# Patient Record
Sex: Male | Born: 1950 | Race: White | Hispanic: No | Marital: Married
Health system: Midwestern US, Community
[De-identification: ages and names within clinical notes are randomized; demographics above are authoritative.]

## PROBLEM LIST (undated history)

## (undated) DIAGNOSIS — I10 Essential (primary) hypertension: Secondary | ICD-10-CM

## (undated) DIAGNOSIS — Z1211 Encounter for screening for malignant neoplasm of colon: Secondary | ICD-10-CM

## (undated) DIAGNOSIS — K2270 Barrett's esophagus without dysplasia: Secondary | ICD-10-CM

## (undated) DIAGNOSIS — N400 Enlarged prostate without lower urinary tract symptoms: Secondary | ICD-10-CM

## (undated) DIAGNOSIS — N5314 Retrograde ejaculation: Secondary | ICD-10-CM

## (undated) DIAGNOSIS — L659 Nonscarring hair loss, unspecified: Secondary | ICD-10-CM

## (undated) HISTORY — PX: HERNIA REPAIR: SHX51

## (undated) HISTORY — DX: Barrett's esophagus without dysplasia: K22.70

## (undated) HISTORY — DX: Essential (primary) hypertension: I10

## (undated) HISTORY — PX: CYSTOSCOPY WITH INSERTION OF UROLIFT: SHX6678

## (undated) HISTORY — DX: Nonscarring hair loss, unspecified: L65.9

## (undated) HISTORY — PX: DENTAL SURGERY: SHX609

## (undated) HISTORY — DX: Retrograde ejaculation: N53.14

## (undated) HISTORY — DX: Benign prostatic hyperplasia without lower urinary tract symptoms: N40.0

---

## 2008-12-04 HISTORY — PX: EYE SURGERY: SHX253

## 2012-01-22 ENCOUNTER — Encounter

## 2012-01-26 NOTE — Other (Signed)
Cardiopulmonary Rehab:  Patient called and calcium score of "551" given.  Education given regarding coronary artery disease and its effects on the cardiovascular system reviewed.  Patient states that they have a family history of coronary artery disease, currently does not take cholesterol medication but does blood pressure medication.  Patient is not a diabetic, is a former smoker quitting 30 years ago, states that he has just started making heart healthy choices this past month and does not exercise. Patient states that he knew something was going to be wrong because lately he has been getting winded fairly easily on exertion.  Patient's primary care is [facility].  Patient requested to come by the hospital to pick up copies of the results.  [facility] number given. Understanding verbalized.

## 2017-04-23 NOTE — H&P (Signed)
66 y.o. male for open access colonoscopy for screening   Additional data for completion of the targeted pre-endoscopy H&P will be provided under 'H&P interval notes'.  Please see that document which will be attached to this.  Leta Spellerhristopher D Destina Mantei, MD  Colon [address]2012 TA [place]ieb [place]

## 2017-04-25 ENCOUNTER — Inpatient Hospital Stay: Payer: PRIVATE HEALTH INSURANCE

## 2017-04-25 LAB — POC H. PYLORI, TISSUE
H. pylori from tissue: NEGATIVE
Lot no.: 293107
Negative control: NEGATIVE
Positive control: POSITIVE

## 2017-04-25 MED ORDER — PROPOFOL 10 MG/ML IV EMUL
10 mg/mL | INTRAVENOUS | Status: DC | PRN
Start: 2017-04-25 — End: 2017-04-25
  Administered 2017-04-25: 14:00:00 via INTRAVENOUS

## 2017-04-25 MED ORDER — FENTANYL CITRATE (PF) 50 MCG/ML IJ SOLN
50 mcg/mL | INTRAMUSCULAR | Status: DC | PRN
Start: 2017-04-25 — End: 2017-04-25

## 2017-04-25 MED ORDER — SIMETHICONE 40 MG/0.6 ML ORAL DROPS, SUSP
40 mg/0.6 mL | ORAL | Status: DC | PRN
Start: 2017-04-25 — End: 2017-04-25

## 2017-04-25 MED ORDER — NALOXONE 0.4 MG/ML INJECTION
0.4 mg/mL | INTRAMUSCULAR | Status: DC | PRN
Start: 2017-04-25 — End: 2017-04-25

## 2017-04-25 MED ORDER — PANTOPRAZOLE 20 MG TAB, DELAYED RELEASE
20 mg | ORAL_TABLET | Freq: Every day | ORAL | 0 refills | Status: AC
Start: 2017-04-25 — End: ?

## 2017-04-25 MED ORDER — LIDOCAINE (PF) 20 MG/ML (2 %) IJ SOLN
20 mg/mL (2 %) | INTRAMUSCULAR | Status: DC | PRN
Start: 2017-04-25 — End: 2017-04-25
  Administered 2017-04-25: 14:00:00 via INTRAVENOUS

## 2017-04-25 MED ORDER — SODIUM CHLORIDE 0.9 % IV
INTRAVENOUS | Status: DC
Start: 2017-04-25 — End: 2017-04-25
  Administered 2017-04-25: 14:00:00 via INTRAVENOUS

## 2017-04-25 MED ORDER — ATROPINE 0.1 MG/ML SYRINGE
0.1 mg/mL | Freq: Once | INTRAMUSCULAR | Status: DC | PRN
Start: 2017-04-25 — End: 2017-04-25

## 2017-04-25 MED ORDER — EPINEPHRINE 0.1 MG/ML SYRINGE
0.1 mg/mL | Freq: Once | INTRAMUSCULAR | Status: DC | PRN
Start: 2017-04-25 — End: 2017-04-25

## 2017-04-25 MED ORDER — FLUMAZENIL 0.1 MG/ML IV SOLN
0.1 mg/mL | INTRAVENOUS | Status: DC | PRN
Start: 2017-04-25 — End: 2017-04-25

## 2017-04-25 MED ORDER — MIDAZOLAM 1 MG/ML IJ SOLN
1 mg/mL | INTRAMUSCULAR | Status: DC | PRN
Start: 2017-04-25 — End: 2017-04-25

## 2017-04-25 MED FILL — SODIUM CHLORIDE 0.9 % IV: INTRAVENOUS | Qty: 1000

## 2017-04-25 NOTE — Procedures (Signed)
Procedures by Earleen Newport, MD at 04/25/17 1014                Author: Earleen Newport, MD  Service: Gastroenterology  Author Type: Physician       Filed: 04/25/17 1045  Date of Service: 04/25/17 1014  Status: Signed          Editor: Earleen Newport, MD (Physician)            Procedures        1. COLONOSCOPY [LKG4010 (Custom)]                                                           Mario D. Ottis Stain, MD   [id number]        Apr 25, 2017      Esophagogastroduodenoscopy & Colonoscopy Procedure Note   Mario Bell   DOB: [birth year]   [name] Medical Record Number: [id number]         Indications:    Barrett's/esophageal ulcer  Personal history of colon polyps (screening only)  and Screening Colonoscopy    Referring Physician:  Raynelle Chary, MD   Anesthesia/Sedation: Conscious Sedation/Moderate Sedation/MAC   Endoscopist:  Dr. Wray Kearns   Complications:  None   Estimated Blood Loss:  None      Permit:   The indications, risks, benefits and alternatives were reviewed with the patient or their decision maker who was provided an opportunity to ask questions  and all questions were answered.  The specific risks of esophagogastroduodenoscopy with conscious sedation were reviewed, including but not limited to anesthetic complication, bleeding, adverse drug reaction, missed lesion, infection, IV site reactions,  and intestinal perforation which would lead to the need for surgical repair.  Alternatives to EGD and colonoscopy including radiographic imaging, observation without testing, or laboratory testing were reviewed as well as the limitations of those alternatives  discussed.  After considering the options and having all their questions answered, the patient or their decision maker provided both verbal and written consent to proceed.        -----------EGD------------    Procedure in Detail:   After  obtaining informed consent, positioning of the patient in the left lateral decubitus position, and conduction of a pre-procedure pause or "time out" the endoscope was introduced into the mouth and advanced to the duodenum.  A careful inspection  was made, and findings or interventions are described below.      Findings:    Esophagus:Short segment Barrett's C1M2.  Cold forceps biopsies taken for histology.   Stomach: Erythema and punctate erosions in the antral mucosa, Biopsies from the mucosa of the gastric antrum and gastric fundus are taken for  rapid helicobacter testing.  Hemostasis is confirmed from biopsy sites.      Duodenum/jejunum: Normal.            ----------Colonoscopy-----------      Procedure in Detail:   After obtaining informed consent, positioning of the patient in the left lateral decubitus position, and conduction of a pre-procedure pause or "time out" the endoscope was introduced into the anus and advanced to the cecum, which was identified by the  ileocecal valve and appendiceal orifice.  The quality of  the colonic preparation was good.  A careful inspection was made as the colonoscope was withdrawn, findings and interventions are described below.      Findings:    Four polyps today   Descending 68m   Sigmoid 153mpedunculated (taken hot), 19m61mnd 4mm4m All taken with snare most cold one hot.      Complete removal, retrieval, and hemostasis.      In the rectum, medium internal hemorrhoids are noted without bleeding.   There is diverticulosis in the sigmoid colon without complications such as bleeding, inflammatory change, or luminal narrowing.         ------------------------------   Specimens:     See above      Complications:    None; patient tolerated the procedure well.      Impressions:   EGD:  Barrett's and mild gastritis.   Colonoscopy: Colon polyps diverticulosis and hemorrhoids         Recommendations:      - Await pathology.   -Acid suppression with a proton pump inhibitor.      Thank  you for entrusting me with this patient's care.  Please do not hesitate to contact me with any questions or if I can be of assistance with any of  your other patients' GI needs.      Signed By: ChriEarleen Newport                         Apr 25, 2017

## 2017-04-25 NOTE — Other (Signed)
Initial RN admission and assessment performed and documented in Endoscopy navigator.     Patient evaluated by anesthesia in pre-procedure holding.    All procedural vital signs, airway assessment, and level of consciousness information monitored and recorded by anesthesia staff on the anesthesia record.

## 2017-04-25 NOTE — Other (Signed)
Judge Stallatrick O Mcginnity  10/28/1951  [id number]    Situation:  Verbal report received from: Maxie Betterebecca Byars RN  Procedure: Procedure(s):  COLONOSCOPY,EGD  ESOPHAGOGASTRODUODENOSCOPY (EGD)  ESOPHAGOGASTRODUODENAL (EGD) BIOPSY    Background:    Preoperative diagnosis: PERSONAL HISTORY COLON POLYPS, BARRETTS  Postoperative diagnosis: Gastritis  Barretts Esophagus  polyps  Diverticulosis  Hemorrhoids  polyps      Operator:  Dr. Nunzio CoryLyons  Assistant(s): Endoscopy Technician-1: Ardeth PerfectSarah Basinger  Endoscopy RN-1: Serita Kyleebecca A Byars, RN  Endoscopy RN-2: Freddie BreechHeidi S Yates, RN    Specimens:   ID Type Source Tests Collected by Time Destination   1 : GE Junction Biopsy Preservative   Leta Spellerhristopher D Lyons, MD 04/25/2017 1022 Pathology   2 :  descending polyp Preservative   Leta Spellerhristopher D Lyons, MD 04/25/2017 1027 Pathology   3 : sigmoid polyp X3 Preservative   Leta Spellerhristopher D Lyons, MD 04/25/2017 1032 Pathology     H. Pylori  yes    Assessment:  Intra-procedure medications     Anesthesia gave intra-procedure sedation and medications, see anesthesia flow sheet yes    Intravenous fluids: NS@ KVO     Vital signs stable     Abdominal assessment: round and soft     Recommendation:  Discharge patient per MD order.  Family or Friend   Permission to share finding with family or friend yes    Endoscopy discharge instructions have been reviewed and given to patient and son.  The patient and son verbalized understanding and acceptance of instructions.

## 2017-04-25 NOTE — Procedures (Signed)
Diamondhead D. Ottis Stain, MD  [phone]      Apr 25, 2017    Esophagogastroduodenoscopy & Colonoscopy Procedure Note  Mario Bell  DOB: 1951/10/23  [name] Medical Record Number: [id number]      Indications:    Barrett's/esophageal ulcer Personal history of colon polyps (screening only)  and Screening Colonoscopy   Referring Physician:  Raynelle Chary, MD  Anesthesia/Sedation: Conscious Sedation/Moderate Sedation/MAC  Endoscopist:  Dr. Wray Kearns  Complications:  None  Estimated Blood Loss:  None    Permit:  The indications, risks, benefits and alternatives were reviewed with the patient or their decision maker who was provided an opportunity to ask questions and all questions were answered.  The specific risks of esophagogastroduodenoscopy with conscious sedation were reviewed, including but not limited to anesthetic complication, bleeding, adverse drug reaction, missed lesion, infection, IV site reactions, and intestinal perforation which would lead to the need for surgical repair.  Alternatives to EGD and colonoscopy including radiographic imaging, observation without testing, or laboratory testing were reviewed as well as the limitations of those alternatives discussed.  After considering the options and having all their questions answered, the patient or their decision maker provided both verbal and written consent to proceed.      -----------EGD------------   Procedure in Detail:  After obtaining informed consent, positioning of the patient in the left lateral decubitus position, and conduction of a pre-procedure pause or "time out" the endoscope was introduced into the mouth and advanced to the duodenum.  A careful inspection was made, and findings or interventions are described below.    Findings:   Esophagus:Short segment Barrett's C1M2.  Cold forceps biopsies taken for histology.   Stomach: Erythema and punctate erosions in the antral mucosa, Biopsies from the mucosa of the gastric antrum and gastric fundus are taken for rapid helicobacter testing.  Hemostasis is confirmed from biopsy sites.    Duodenum/jejunum: Normal.        ----------Colonoscopy-----------    Procedure in Detail:  After obtaining informed consent, positioning of the patient in the left lateral decubitus position, and conduction of a pre-procedure pause or "time out" the endoscope was introduced into the anus and advanced to the cecum, which was identified by the ileocecal valve and appendiceal orifice.  The quality of the colonic preparation was good.  A careful inspection was made as the colonoscope was withdrawn, findings and interventions are described below.    Findings:   Four polyps today  Descending 43m  Sigmoid 133mpedunculated (taken hot), 88m30mnd 4mm52mAll taken with snare most cold one hot.    Complete removal, retrieval, and hemostasis.    In the rectum, medium internal hemorrhoids are noted without bleeding.  There is diverticulosis in the sigmoid colon without complications such as bleeding, inflammatory change, or luminal narrowing.      ------------------------------  Specimens:    See above    Complications:   None; patient tolerated the procedure well.    Impressions:  EGD:  Barrett's and mild gastritis.  Colonoscopy: Colon polyps diverticulosis and hemorrhoids      Recommendations:     - Await pathology.  -Acid suppression with a proton pump inhibitor.    Thank you for entrusting me with this patient's care.  Please do not hesitate to contact me with any questions or if I can be of assistance with any of your other patients' GI needs.  Signed By: Earleen Newport, MD                        Apr 25, 2017

## 2017-04-25 NOTE — Other (Signed)
H.Pylori obtained.

## 2017-04-25 NOTE — Other (Signed)
Report form Jennifer, CRNA, see anesthesia record. ABD remains soft and non-tender post procedure.  Pt has no complaints at this time and tolerated the procedure well.    Endoscope was pre-cleaned at bedside immediately following procedure by Sarah.

## 2017-04-25 NOTE — Anesthesia Post-Procedure Evaluation (Signed)
Post-Anesthesia Evaluation and Assessment    Patient: Mario Bell MRN: [id number]  SSN: [id number]    Date of Birth: 08-07-1951  Age: 66 y.o.  Sex: male       Cardiovascular Function/Vital Signs  Visit Vitals   ??? BP 95/61   ??? Pulse (!) 53   ??? Temp 36.7 ??C (98 ??F)   ??? Resp 15   ??? Ht 5' 10"  (1.778 m)   ??? Wt 93 kg (205 lb)   ??? SpO2 96%   ??? BMI 29.41 kg/m2       Patient is status post MAC anesthesia for Procedure(s):  COLONOSCOPY,EGD  ESOPHAGOGASTRODUODENOSCOPY (EGD)  ESOPHAGOGASTRODUODENAL (EGD) BIOPSY  ENDOSCOPIC POLYPECTOMY.    Nausea/Vomiting: None    Postoperative hydration reviewed and adequate.    Pain:  Pain Scale 1: Numeric (0 - 10) (04/25/17 1000)  Pain Intensity 1: 0 (04/25/17 1000)   Managed    Neurological Status:       At baseline    Mental Status and Level of Consciousness: Arousable    Pulmonary Status:   O2 Device: Nasal cannula (04/25/17 1043)   Adequate oxygenation and airway patent    Complications related to anesthesia: None    Post-anesthesia assessment completed. No concerns    Signed By: Bethanne Ginger, MD     Apr 25, 2017

## 2017-04-25 NOTE — Interval H&P Note (Signed)
Pre-Endoscopy H&P Update  Chief complaint/HPI/ROS:  The indication for the procedure, the patient's history and the patient's current medications are reviewed prior to the procedure and that data is reported on the H&P to which this document is attached.  Any significant complaints with regard to organ systems will be noted, and if not mentioned then a review of systems is not contributory.  Past Medical History:   Diagnosis Date   ??? Arthritis    ??? Hypertension       Past Surgical History:   Procedure Laterality Date   ??? HX GI     ??? HX HEENT      eye surgery   ??? HX HERNIA REPAIR       Social   Social History   Substance Use Topics   ??? Smoking status: Former Smoker   ??? Smokeless tobacco: Former NeurosurgeonUser     Quit date: 04/25/1982   ??? Alcohol use 1.2 oz/week     2 Cans of beer per week      No family history on file.   No Known Allergies   Prior to Admission Medications   Prescriptions Last Dose Informant Patient Reported? Taking?   bisoprolol-hydrochlorothiazide [facility](ZIAC) 5-6.25 mg per tablet 04/24/2017 at Unknown time  Yes Yes   Sig: Take 1 Tab by mouth daily.      Facility-Administered Medications: None       PHYSICAL EXAM:  The patient is examined immediately prior to the procedure.  Visit Vitals   ??? BP 146/84   ??? Pulse (!) 59   ??? Temp 97.8 ??F (36.6 ??C)   ??? Resp 18   ??? Ht 5\' 10"  (1.778 m)   ??? Wt 93 kg (205 lb)   ??? SpO2 99%   ??? BMI 29.41 kg/m2     Gen: Appears comfortable, no distress.  Pulm: comfortable respirations with no abnormal audible breath sounds  CV: heart regular, well perfused  GI: abdomen flat.    PLAN:  Informed consent discussion held, patient afforded an opportunity to ask questions and all questions answered.  After being advised of the risks, benefits, and alternatives, the patient requested that we proceed and indicated so on a written consent form.      Will proceed with procedure as planned.  Leta Spellerhristopher D Ahmaad Neidhardt, MD

## 2017-04-25 NOTE — Anesthesia Pre-Procedure Evaluation (Signed)
Anesthetic History   No history of anesthetic complications            Review of Systems / Medical History  Patient summary reviewed, nursing notes reviewed and pertinent labs reviewed    Pulmonary  Within defined limits                 Neuro/Psych   Within defined limits           Cardiovascular  Within defined limits  Hypertension              Exercise tolerance: >4 METS     GI/Hepatic/Renal  Within defined limits              Endo/Other  Within defined limits           Other Findings              Physical Exam    Airway  Mallampati: II    Neck ROM: normal range of motion   Mouth opening: Normal     Cardiovascular  Regular rate and rhythm,  S1 and S2 normal,  no murmur, click, rub, or gallop  Rhythm: regular  Rate: normal         Dental  No notable dental hx       Pulmonary  Breath sounds clear to auscultation               Abdominal  GI exam deferred       Other Findings            Anesthetic Plan    ASA: 2  Anesthesia type: MAC          Induction: Intravenous  Anesthetic plan and risks discussed with: Patient

## 2017-04-26 MED FILL — PROPOFOL 10 MG/ML IV EMUL: 10 mg/mL | INTRAVENOUS | Qty: 267.84

## 2017-04-26 MED FILL — PROPOFOL 10 MG/ML IV EMUL: 10 mg/mL | INTRAVENOUS | Qty: 150

## 2017-04-26 MED FILL — LIDOCAINE (PF) 20 MG/ML (2 %) IJ SOLN: 20 mg/mL (2 %) | INTRAMUSCULAR | Qty: 40

## 2018-05-26 DIAGNOSIS — B001 Herpesviral vesicular dermatitis: Secondary | ICD-10-CM | POA: Diagnosis not present

## 2018-05-26 DIAGNOSIS — H00025 Hordeolum internum left lower eyelid: Secondary | ICD-10-CM | POA: Diagnosis not present

## 2018-05-26 DIAGNOSIS — H00024 Hordeolum internum left upper eyelid: Secondary | ICD-10-CM | POA: Diagnosis not present

## 2018-11-16 DIAGNOSIS — B001 Herpesviral vesicular dermatitis: Secondary | ICD-10-CM | POA: Diagnosis not present

## 2019-03-26 ENCOUNTER — Ambulatory Visit: Payer: Medicare HMO | Admitting: Family Medicine

## 2019-04-01 NOTE — Progress Notes (Signed)
Virtual Visit via Video   Due to the COVID-19 pandemic, this visit was completed with telemedicine (audio/video) technology to reduce patient and provider exposure as well as to preserve personal protective equipment.   I connected with Frank Wilkins by a video enabled telemedicine application and verified that I am speaking with the correct person using two identifiers. Location patient: Home Location provider: [facility], Office Persons participating in the virtual visit: Makson Fusillo, Helane Rima, DO   I discussed the limitations of evaluation and management by telemedicine and the availability of in person appointments. The patient expressed understanding and agreed to proceed.  Care Team   Patient Care Team: Helane Rima, DO as PCP - General (Family Medicine)  Subjective:   HPI:   Social History   Social History Narrative   Lifescreening. Mild on CVD test, Mild on carotid test, AAA normal, PAD normal, osteoporosis low risk.      Retired. Weight increased from 195 to 225. "I eat what I want." No exercise. Former smoker. Drinks "a couple" of beer per day and has for years.       Per patient, Hx of Barrett's esophagus, s/p EGD two years ago.       BPH, s/p cystoscopy with "prostate flap." Avodart caused retrograde ejaculation.    Review of Systems  Constitutional: Negative for chills, fever, malaise/fatigue and weight loss.  Respiratory: Negative for cough, shortness of breath and wheezing.   Cardiovascular: Negative for chest pain, palpitations and leg swelling.  Gastrointestinal: Negative for abdominal pain, constipation, diarrhea, nausea and vomiting.  Genitourinary: Negative for dysuria and urgency.  Musculoskeletal: Negative for joint pain and myalgias.  Skin: Negative for rash.  Neurological: Negative for dizziness and headaches.  Psychiatric/Behavioral: Negative for depression, substance abuse and suicidal ideas. The patient is not nervous/anxious.       Social History   Tobacco Use  . Smoking status: Former Smoker    Types: Cigarettes    Last attempt to quit: 1980    Years since quitting: 40.3  . Smokeless tobacco: Never Used  Substance Use Topics  . Alcohol use: Yes    Alcohol/week: 14.0 - 21.0 standard drinks    Types: 14 - 21 Cans of beer per week    Comment: 2-3 beer/day    Current Outpatient Medications:  .  lisinopril-hydrochlorothiazide (ZESTORETIC) 10-12.5 MG tablet, Take 1 tablet by mouth daily., Disp: 30 tablet, Rfl: 1  No Known Allergies  Objective:   VITALS: Per patient if applicable, see vitals. GENERAL: Alert and in no acute distress. CARDIOPULMONARY: No increased WOB. Speaking in clear sentences.  PSYCH: Pleasant and cooperative. Speech normal rate and rhythm. Affect is appropriate. Insight and judgement are appropriate. Attention is focused, linear, and appropriate.  NEURO: Oriented as arrived to appointment on time with no prompting.   Assessment and Plan:   Frank Wilkins was seen today for establish care.  Diagnoses and all orders for this visit:  Essential hypertension -     lisinopril-hydrochlorothiazide (ZESTORETIC) 10-12.5 MG tablet; Take 1 tablet by mouth daily.  Barrett's esophagus with dysplasia  Alopecia  Benign prostatic hyperplasia, unspecified whether lower urinary tract symptoms present  Continue current treatment. Will set up a CPE with labs (patient does want PSA) in 1-2 months.   . COVID-19 Education: The signs and symptoms of COVID-19 were discussed with the patient and how to seek care for testing if needed. The importance of social distancing was discussed today. . Reviewed expectations re: course of current medical issues. Marland Kitchen  Discussed self-management of symptoms. . Outlined signs and symptoms indicating need for more acute intervention. . Patient verbalized understanding and all questions were answered. Marland Kitchen. Health Maintenance issues including appropriate healthy diet, exercise, and  smoking avoidance were discussed with patient. . See orders for this visit as documented in the electronic medical record.  Helane RimaErica Stephenie Navejas, DO 04/06/2019  Records requested if needed. Time spent: 30 minutes, of which >50% was spent in obtaining information about his symptoms, reviewing his previous labs, evaluations, and treatments, counseling him about his condition (please see the discussed topics above), and developing a plan to further investigate it; he had a number of questions which I addressed.

## 2019-04-02 ENCOUNTER — Other Ambulatory Visit: Payer: Self-pay

## 2019-04-02 ENCOUNTER — Ambulatory Visit (INDEPENDENT_AMBULATORY_CARE_PROVIDER_SITE_OTHER): Payer: Medicare HMO | Admitting: Family Medicine

## 2019-04-02 ENCOUNTER — Encounter: Payer: Self-pay | Admitting: Family Medicine

## 2019-04-02 ENCOUNTER — Telehealth: Payer: Self-pay | Admitting: General Practice

## 2019-04-02 VITALS — BP 142/85 | Temp 98.2°F | Ht 70.0 in | Wt 225.0 lb

## 2019-04-02 DIAGNOSIS — K22719 Barrett's esophagus with dysplasia, unspecified: Secondary | ICD-10-CM | POA: Diagnosis not present

## 2019-04-02 DIAGNOSIS — N400 Enlarged prostate without lower urinary tract symptoms: Secondary | ICD-10-CM | POA: Diagnosis not present

## 2019-04-02 DIAGNOSIS — I10 Essential (primary) hypertension: Secondary | ICD-10-CM

## 2019-04-02 DIAGNOSIS — L659 Nonscarring hair loss, unspecified: Secondary | ICD-10-CM

## 2019-04-02 MED ORDER — LISINOPRIL-HYDROCHLOROTHIAZIDE 10-12.5 MG PO TABS: 1.0000 | ORAL_TABLET | Freq: Every day | ORAL | 1 refills | Status: DC

## 2019-04-02 NOTE — Telephone Encounter (Signed)
See note  Copied from CRM [id number]. Topic: General - Inquiry >> Apr 02, 2019  1:24 PM Baldo Daub L wrote: Reason for CRM:   Pt states that on his virtual visit this morning he was told medication would be called into the pharmacy, but nothing has been called in yet.  Pt is calling to check on this.

## 2019-04-02 NOTE — Telephone Encounter (Signed)
Called patient let him know called in.

## 2019-04-05 ENCOUNTER — Encounter: Payer: Self-pay | Admitting: Family Medicine

## 2019-04-06 ENCOUNTER — Encounter: Payer: Self-pay | Admitting: Family Medicine

## 2019-04-06 DIAGNOSIS — I10 Essential (primary) hypertension: Secondary | ICD-10-CM | POA: Insufficient documentation

## 2019-04-06 DIAGNOSIS — N400 Enlarged prostate without lower urinary tract symptoms: Secondary | ICD-10-CM | POA: Insufficient documentation

## 2019-04-06 DIAGNOSIS — L659 Nonscarring hair loss, unspecified: Secondary | ICD-10-CM | POA: Insufficient documentation

## 2019-04-06 DIAGNOSIS — K2270 Barrett's esophagus without dysplasia: Secondary | ICD-10-CM | POA: Insufficient documentation

## 2019-06-24 ENCOUNTER — Telehealth: Payer: Self-pay | Admitting: Family Medicine

## 2019-06-24 ENCOUNTER — Other Ambulatory Visit: Payer: Self-pay

## 2019-06-24 DIAGNOSIS — I10 Essential (primary) hypertension: Secondary | ICD-10-CM

## 2019-06-24 MED ORDER — LISINOPRIL-HYDROCHLOROTHIAZIDE 10-12.5 MG PO TABS: 1.0000 | ORAL_TABLET | Freq: Every day | ORAL | 1 refills | Status: DC

## 2019-06-24 NOTE — Telephone Encounter (Signed)
Last fill 04/02/19  #30/1 LAST APPOINTMENT DATE: @4 /29/2020  NEXT APPOINTMENT DATE:@8 /26/2020

## 2019-06-24 NOTE — Telephone Encounter (Signed)
MEDICATION: lisinopril-hydrochlorothiazide (ZESTORETIC) 10-12.5 MG tablet  PHARMACY:  [place] [place] A 90 DAY SUPPLY : No, 30 day   IS PATIENT OUT OF MEDICATION: Yes   IF NOT; HOW MUCH IS LEFT:   LAST APPOINTMENT DATE: @4 /29/2020  NEXT APPOINTMENT DATE:@8 /26/2020  OTHER COMMENTS:    **Let patient know to contact pharmacy at the end of the day to make sure medication is ready. **  ** Please notify patient to allow 48-72 hours to process**  **Encourage patient to contact the pharmacy for refills or they can request refills through [facility]**

## 2019-07-22 DIAGNOSIS — H04123 Dry eye syndrome of bilateral lacrimal glands: Secondary | ICD-10-CM | POA: Diagnosis not present

## 2019-07-30 ENCOUNTER — Ambulatory Visit (INDEPENDENT_AMBULATORY_CARE_PROVIDER_SITE_OTHER): Payer: Medicare HMO | Admitting: Family Medicine

## 2019-07-30 ENCOUNTER — Other Ambulatory Visit: Payer: Self-pay | Admitting: Family Medicine

## 2019-07-30 ENCOUNTER — Other Ambulatory Visit: Payer: Self-pay

## 2019-07-30 ENCOUNTER — Encounter: Payer: Self-pay | Admitting: Family Medicine

## 2019-07-30 ENCOUNTER — Ambulatory Visit (INDEPENDENT_AMBULATORY_CARE_PROVIDER_SITE_OTHER): Payer: Medicare HMO

## 2019-07-30 VITALS — BP 140/82 | HR 81 | Temp 97.8°F | Ht 70.0 in | Wt 235.8 lb

## 2019-07-30 DIAGNOSIS — R5381 Other malaise: Secondary | ICD-10-CM

## 2019-07-30 DIAGNOSIS — I10 Essential (primary) hypertension: Secondary | ICD-10-CM | POA: Diagnosis not present

## 2019-07-30 DIAGNOSIS — Z9189 Other specified personal risk factors, not elsewhere classified: Secondary | ICD-10-CM

## 2019-07-30 DIAGNOSIS — Z0000 Encounter for general adult medical examination without abnormal findings: Secondary | ICD-10-CM | POA: Diagnosis not present

## 2019-07-30 DIAGNOSIS — Z13220 Encounter for screening for lipoid disorders: Secondary | ICD-10-CM | POA: Diagnosis not present

## 2019-07-30 DIAGNOSIS — N400 Enlarged prostate without lower urinary tract symptoms: Secondary | ICD-10-CM

## 2019-07-30 DIAGNOSIS — R059 Cough, unspecified: Secondary | ICD-10-CM

## 2019-07-30 DIAGNOSIS — R69 Illness, unspecified: Secondary | ICD-10-CM | POA: Diagnosis not present

## 2019-07-30 DIAGNOSIS — H00015 Hordeolum externum left lower eyelid: Secondary | ICD-10-CM | POA: Diagnosis not present

## 2019-07-30 DIAGNOSIS — L659 Nonscarring hair loss, unspecified: Secondary | ICD-10-CM

## 2019-07-30 DIAGNOSIS — R918 Other nonspecific abnormal finding of lung field: Secondary | ICD-10-CM | POA: Diagnosis not present

## 2019-07-30 DIAGNOSIS — Z1159 Encounter for screening for other viral diseases: Secondary | ICD-10-CM

## 2019-07-30 DIAGNOSIS — R05 Cough: Secondary | ICD-10-CM | POA: Diagnosis not present

## 2019-07-30 DIAGNOSIS — F1020 Alcohol dependence, uncomplicated: Secondary | ICD-10-CM

## 2019-07-30 DIAGNOSIS — R5383 Other fatigue: Secondary | ICD-10-CM | POA: Diagnosis not present

## 2019-07-30 DIAGNOSIS — K22719 Barrett's esophagus with dysplasia, unspecified: Secondary | ICD-10-CM

## 2019-07-30 DIAGNOSIS — E559 Vitamin D deficiency, unspecified: Secondary | ICD-10-CM

## 2019-07-30 LAB — COMPREHENSIVE METABOLIC PANEL
ALT: 54 U/L — ABNORMAL HIGH (ref 0–53)
AST: 46 U/L — ABNORMAL HIGH (ref 0–37)
Albumin: 4.3 g/dL (ref 3.5–5.2)
Alkaline Phosphatase: 63 U/L (ref 39–117)
BUN: 11 mg/dL (ref 6–23)
CO2: 29 mEq/L (ref 19–32)
Calcium: 9.7 mg/dL (ref 8.4–10.5)
Chloride: 99 mEq/L (ref 96–112)
Creatinine, Ser: 1.05 mg/dL (ref 0.40–1.50)
GFR: 69.99 mL/min (ref >60.00)
Glucose, Bld: 109 mg/dL — ABNORMAL HIGH (ref 70–99)
Potassium: 4.8 mEq/L (ref 3.5–5.1)
Sodium: 138 mEq/L (ref 135–145)
Total Bilirubin: 0.6 mg/dL (ref 0.2–1.2)
Total Protein: 7.3 g/dL (ref 6.0–8.3)

## 2019-07-30 LAB — CBC WITH DIFFERENTIAL/PLATELET
Basophils Absolute: 0.1 10*3/uL (ref 0.0–0.1)
Basophils Relative: 1.3 % (ref 0.0–3.0)
Eosinophils Absolute: 0.2 10*3/uL (ref 0.0–0.7)
Eosinophils Relative: 4.1 % (ref 0.0–5.0)
HCT: 46.3 % (ref 39.0–52.0)
Hemoglobin: 15.8 g/dL (ref 13.0–17.0)
Lymphocytes Relative: 35.3 % (ref 12.0–46.0)
Lymphs Abs: 1.9 10*3/uL (ref 0.7–4.0)
MCHC: 34.1 g/dL (ref 30.0–36.0)
MCV: 98.1 fl (ref 78.0–100.0)
Monocytes Absolute: 0.6 10*3/uL (ref 0.1–1.0)
Monocytes Relative: 11.2 % (ref 3.0–12.0)
Neutro Abs: 2.6 10*3/uL (ref 1.4–7.7)
Neutrophils Relative %: 48.1 % (ref 43.0–77.0)
Platelets: 205 10*3/uL (ref 150.0–400.0)
RBC: 4.72 Mil/uL (ref 4.22–5.81)
RDW: 13.3 % (ref 11.5–15.5)
WBC: 5.3 10*3/uL (ref 4.0–10.5)

## 2019-07-30 LAB — LIPID PANEL
Cholesterol: 231 mg/dL — ABNORMAL HIGH (ref 0–200)
HDL: 37.8 mg/dL — ABNORMAL LOW (ref >39.00)
NonHDL: 193.44
Total CHOL/HDL Ratio: 6
Triglycerides: 341 mg/dL — ABNORMAL HIGH (ref 0.0–149.0)
VLDL: 68.2 mg/dL — ABNORMAL HIGH (ref 0.0–40.0)

## 2019-07-30 LAB — LDL CHOLESTEROL, DIRECT: Direct LDL: 146 mg/dL

## 2019-07-30 LAB — VITAMIN D 25 HYDROXY (VIT D DEFICIENCY, FRACTURES): VITD: 24.02 ng/mL — ABNORMAL LOW (ref 30.00–100.00)

## 2019-07-30 LAB — VITAMIN B12: Vitamin B-12: 238 pg/mL (ref 211–911)

## 2019-07-30 LAB — PSA: PSA: 1.31 ng/mL (ref 0.10–4.00)

## 2019-07-30 LAB — MAGNESIUM: Magnesium: 1.9 mg/dL (ref 1.5–2.5)

## 2019-07-30 LAB — TSH: TSH: 2.06 u[IU]/mL (ref 0.35–4.50)

## 2019-07-30 MED ORDER — ERYTHROMYCIN 5 MG/GM OP OINT: 1.0000 "application " | TOPICAL_OINTMENT | Freq: Two times a day (BID) | OPHTHALMIC | 0 refills | Status: DC

## 2019-07-30 NOTE — Progress Notes (Signed)
Subjective:    Frank Wilkins is a 68 y.o. male who presents today for his Complete Annual Exam.  Lifescreening. Mild on CVD test, Mild on carotid test, AAA normal, PAD normal, osteoporosis low risk.  Retired. Weight increased from 195 to 225. "I eat what I want." No exercise. Former smoker. Drinks "a couple" of beer per day and has for years.   Per patient, Hx of Barrett's esophagus, s/p EGD two years ago. Okay per patient. Not on PPI. Hx of hiatal hernia surgery with resolution of symptoms.  BPH, s/p cystoscopy with "prostate flap." Avodart caused retrograde ejaculation. Needs Urology referral.  Chronic cough. Dry. Worse at night. Some allergy symptoms. Denies current reflux. On ACE for years. Former smoker.  Current Outpatient Medications:  .  lisinopril-hydrochlorothiazide (ZESTORETIC) 10-12.5 MG tablet, Take 1 tablet by mouth daily., Disp: 30 tablet, Rfl: 1  Health Maintenance Due  Topic Date Due  . Hepatitis C Screening  [birth year]  . TETANUS/TDAP  05/10/1969  . COLONOSCOPY  05/10/2000  . PNA vac Low Risk Adult (1 of 2 - PCV13) 05/11/2015  . INFLUENZA VACCINE  07/05/2019    PMHx, SurgHx, SocialHx, Medications, and Allergies were reviewed in the Visit Navigator and updated as appropriate.   Past Medical History:  Diagnosis Date  . Alopecia   . Barrett esophagus   . BPH (benign prostatic hyperplasia)   . HTN (hypertension)   . Retrograde ejaculation      Past Surgical History:  Procedure Laterality Date  . EYE SURGERY  2010    History reviewed. No pertinent family history.  Social History   Tobacco Use  . Smoking status: Former Smoker    Types: Cigarettes    Quit date: 1980    Years since quitting: 40.6  . Smokeless tobacco: Never Used  Substance Use Topics  . Alcohol use: Yes    Alcohol/week: 14.0 - 21.0 standard drinks    Types: 14 - 21 Cans of beer per week    Comment: 2-3 beer/day  . Drug use: Never    Review of Systems:   Pertinent items are  noted in the HPI. Otherwise, ROS is negative.  Objective:    Vitals:   07/30/19 0931 07/30/19 1015  BP: (!) 150/90 140/82  Pulse: 81   Temp: 97.8 F (36.6 C)   SpO2: 99%    Body mass index is 33.83 kg/m.  General  Alert, cooperative, no distress, appears stated age  Head:  Normocephalic, without obvious abnormality, atraumatic  Eyes:  PERRL, conjunctiva/corneas clear, EOM's intact, fundi benign, both eyes. Stye left lower lid.    Ears:  Normal TM's and external ear canals, both ears  Nose: Nares normal, septum midline, mucosa normal, no drainage or sinus tenderness  Throat: Lips, mucosa, and tongue normal; teeth and gums normal  Neck: Supple, symmetrical, trachea midline, no adenopathy; thyroid: no enlargement/tenderness/nodules; no carotid bruit or JVD  Back:   Symmetric, no curvature, ROM normal, no CVA tenderness  Lungs:   Clear to auscultation bilaterally, respirations unlabored  Chest Wall:  No tenderness or deformity  Heart:  Regular rate and rhythm, S1 and S2 normal, no murmur, rub or gallop  Abdomen:   Soft, non-tender, bowel sounds active all four quadrants, no masses, no organomegaly  Extremities: Extremities normal, atraumatic, no cyanosis or edema  Prostate: Not done.  Skin: Skin color, texture, turgor normal, no rashes or lesions  Lymph: Cervical, supraclavicular, and axillary nodes normal  Neurologic: CNII-XII grossly intact. Normal strength, sensation and  reflexes throughout   AssessmentPlan:   Frank Wilkins was seen today for annual exam.  Diagnoses and all orders for this visit:  Routine physical examination  Essential hypertension -     Comprehensive metabolic panel -     Magnesium  Benign prostatic hyperplasia, unspecified whether lower urinary tract symptoms present -     PSA -     Ambulatory referral to Urology  Alopecia  Barrett's esophagus with dysplasia -     CBC with Differential/Platelet  Encounter for hepatitis C virus screening test for  high risk patient -     Hepatitis C antibody  Screening for lipid disorders -     Lipid panel  Vitamin D deficiency -     VITAMIN D 25 Hydroxy (Vit-D Deficiency, Fractures)  Cough -     Cancel: DG Chest 1 View; Future  Hordeolum externum of left lower eyelid -     erythromycin ophthalmic ointment; Place 1 application into the left eye 2 (two) times daily.  Alcohol dependence, daily use (HCC) -     CBC with Differential/Platelet -     Comprehensive metabolic panel -     Vitamin B12 -     Vitamin B1  Malaise and fatigue -     CBC with Differential/Platelet -     Comprehensive metabolic panel -     Magnesium -     TSH -     Vitamin B12 -     Vitamin B1    Patient Counseling: [x]   Nutrition: Stressed importance of moderation in sodium/caffeine intake, saturated fat and cholesterol, caloric balance, sufficient intake of fresh fruits, vegetables, and fiber  [x]   Stressed the importance of regular exercise.   []   Substance Abuse: Discussed cessation/primary prevention of tobacco, alcohol, or other drug use; driving or other dangerous activities under the influence; availability of treatment for abuse.   [x]   Injury prevention: Discussed safety belts, safety helmets, smoke detector, smoking near bedding or upholstery.   []   Sexuality: Discussed sexually transmitted diseases, partner selection, use of condoms, avoidance of unintended pregnancy  and contraceptive alternatives.   [x]   Dental health: Discussed importance of regular tooth brushing, flossing, and dental visits.  [x]   Health maintenance and immunizations reviewed. Please refer to Health maintenance section.   Briscoe Deutscher, DO [place]

## 2019-07-31 ENCOUNTER — Telehealth: Payer: Self-pay

## 2019-07-31 NOTE — Telephone Encounter (Signed)
Who did you want to send him to?

## 2019-07-31 NOTE — Telephone Encounter (Signed)
Copied from [place] [id number]. Topic: General - Other >> Jul 31, 2019 11:19 AM [name] wrote: Reason for CRM: Patient called to say that during his visit on 07/30/2019 Frank Wilkins told him she was recommending that he see a different provider and made some suggestions. But per patient he did not remember the doctors names that she mentioned he would like a call back please with the name of these doctors and possible contact information. Please call  Ph# [phone]

## 2019-08-01 NOTE — Telephone Encounter (Signed)
You recommended Dr. Ellison Hughs.

## 2019-08-03 LAB — HEPATITIS C ANTIBODY
Hepatitis C Ab: NONREACTIVE
SIGNAL TO CUT-OFF: 0.01 (ref <1.00)

## 2019-08-03 LAB — VITAMIN B1: Vitamin B1 (Thiamine): <6 nmol/L — ABNORMAL LOW (ref 8–30)

## 2019-08-04 ENCOUNTER — Other Ambulatory Visit: Payer: Self-pay

## 2019-08-04 DIAGNOSIS — R05 Cough: Secondary | ICD-10-CM

## 2019-08-04 DIAGNOSIS — R059 Cough, unspecified: Secondary | ICD-10-CM

## 2019-08-04 NOTE — Telephone Encounter (Signed)
Patient aware.

## 2019-08-04 NOTE — Telephone Encounter (Signed)
Pt returned vm from [place]. Unavailable. Please return call. CB#[phone]

## 2019-08-04 NOTE — Telephone Encounter (Signed)
Left message to return call to our office.  

## 2019-08-04 NOTE — Telephone Encounter (Signed)
See note

## 2019-08-05 ENCOUNTER — Other Ambulatory Visit: Payer: Self-pay

## 2019-08-05 DIAGNOSIS — I10 Essential (primary) hypertension: Secondary | ICD-10-CM

## 2019-08-05 DIAGNOSIS — R9389 Abnormal findings on diagnostic imaging of other specified body structures: Secondary | ICD-10-CM

## 2019-08-05 MED ORDER — ROSUVASTATIN CALCIUM 10 MG PO TABS: 10.0000 mg | ORAL_TABLET | Freq: Every day | ORAL | 3 refills | Status: AC

## 2019-08-05 MED ORDER — LISINOPRIL-HYDROCHLOROTHIAZIDE 10-12.5 MG PO TABS: 1.0000 | ORAL_TABLET | Freq: Every day | ORAL | 4 refills | Status: DC

## 2019-08-05 MED ORDER — VITAMIN D (ERGOCALCIFEROL) 1.25 MG (50000 UNIT) PO CAPS: 50000.0000 [IU] | ORAL_CAPSULE | ORAL | 0 refills | Status: DC

## 2019-08-06 ENCOUNTER — Other Ambulatory Visit: Payer: Medicare HMO

## 2019-08-06 ENCOUNTER — Ambulatory Visit: Payer: Medicare HMO

## 2019-08-07 ENCOUNTER — Other Ambulatory Visit: Payer: Medicare HMO

## 2019-08-07 ENCOUNTER — Other Ambulatory Visit: Payer: Self-pay

## 2019-08-07 ENCOUNTER — Ambulatory Visit (INDEPENDENT_AMBULATORY_CARE_PROVIDER_SITE_OTHER): Payer: Medicare HMO

## 2019-08-07 DIAGNOSIS — R9389 Abnormal findings on diagnostic imaging of other specified body structures: Secondary | ICD-10-CM

## 2019-08-07 DIAGNOSIS — M898X8 Other specified disorders of bone, other site: Secondary | ICD-10-CM | POA: Diagnosis not present

## 2019-09-23 DIAGNOSIS — R69 Illness, unspecified: Secondary | ICD-10-CM | POA: Diagnosis not present

## 2019-09-30 DIAGNOSIS — R69 Illness, unspecified: Secondary | ICD-10-CM | POA: Diagnosis not present

## 2019-10-22 DIAGNOSIS — Z79899 Other long term (current) drug therapy: Secondary | ICD-10-CM | POA: Diagnosis not present

## 2019-10-22 DIAGNOSIS — R351 Nocturia: Secondary | ICD-10-CM | POA: Diagnosis not present

## 2019-10-22 DIAGNOSIS — R413 Other amnesia: Secondary | ICD-10-CM | POA: Diagnosis not present

## 2019-10-22 DIAGNOSIS — I2510 Atherosclerotic heart disease of native coronary artery without angina pectoris: Secondary | ICD-10-CM | POA: Diagnosis not present

## 2019-10-22 DIAGNOSIS — R05 Cough: Secondary | ICD-10-CM | POA: Diagnosis not present

## 2019-10-22 DIAGNOSIS — K219 Gastro-esophageal reflux disease without esophagitis: Secondary | ICD-10-CM | POA: Diagnosis not present

## 2019-10-22 DIAGNOSIS — E785 Hyperlipidemia, unspecified: Secondary | ICD-10-CM | POA: Diagnosis not present

## 2019-10-22 DIAGNOSIS — I10 Essential (primary) hypertension: Secondary | ICD-10-CM | POA: Diagnosis not present

## 2019-10-22 DIAGNOSIS — E559 Vitamin D deficiency, unspecified: Secondary | ICD-10-CM | POA: Diagnosis not present

## 2019-10-22 DIAGNOSIS — R739 Hyperglycemia, unspecified: Secondary | ICD-10-CM | POA: Diagnosis not present

## 2020-01-12 ENCOUNTER — Ambulatory Visit: Payer: Self-pay | Attending: Internal Medicine

## 2020-01-12 DIAGNOSIS — Z23 Encounter for immunization: Secondary | ICD-10-CM | POA: Insufficient documentation

## 2020-01-12 NOTE — Progress Notes (Signed)
   Covid-19 Vaccination Clinic  Name:  Jerick Khachatryan    MRN: [id number] DOB: 1951/04/25  01/12/2020  Mr. Oien was observed post Covid-19 immunization for 15 minutes without incidence. He was provided with Vaccine Information Sheet and instruction to access the V-Safe system.   Mr. Gillie was instructed to call 911 with any severe reactions post vaccine: Marland Kitchen Difficulty breathing  . Swelling of your face and throat  . A fast heartbeat  . A bad rash all over your body  . Dizziness and weakness    Immunizations Administered    Name Date Dose VIS Date Route   Pfizer COVID-19 Vaccine 01/12/2020 11:53 AM 0.3 mL 11/14/2019 Intramuscular   Manufacturer: [employer], [employer]   Lot: [id number]   NDC: [address]-8

## 2020-02-08 ENCOUNTER — Ambulatory Visit: Payer: Medicare Other | Attending: Internal Medicine

## 2020-02-08 DIAGNOSIS — Z23 Encounter for immunization: Secondary | ICD-10-CM | POA: Insufficient documentation

## 2020-02-08 NOTE — Progress Notes (Signed)
   Covid-19 Vaccination Clinic  Name:  Frankie Scipio    MRN: [id number] DOB: 01/25/1951  02/08/2020  Mr. Putt was observed post Covid-19 immunization for 15 minutes without incident. He was provided with Vaccine Information Sheet and instruction to access the V-Safe system.   Mr. Kulpa was instructed to call 911 with any severe reactions post vaccine: Marland Kitchen Difficulty breathing  . Swelling of face and throat  . A fast heartbeat  . A bad rash all over body  . Dizziness and weakness   Immunizations Administered    Name Date Dose VIS Date Route   Pfizer COVID-19 Vaccine 02/08/2020  8:09 AM 0.3 mL 11/14/2019 Intramuscular   Manufacturer: [employer], [employer]   Lot: [id number]   NDC: [address]-1

## 2020-06-05 ENCOUNTER — Other Ambulatory Visit: Payer: Self-pay | Admitting: Family Medicine

## 2020-06-08 NOTE — Telephone Encounter (Signed)
Please review

## 2020-06-10 ENCOUNTER — Other Ambulatory Visit: Payer: Self-pay | Admitting: Family Medicine

## 2020-06-28 ENCOUNTER — Ambulatory Visit (INDEPENDENT_AMBULATORY_CARE_PROVIDER_SITE_OTHER)
Admission: EM | Admit: 2020-06-28 | Discharge: 2020-06-28 | Disposition: A | Discharge status: Other Acute Inpatient Hospital | Payer: Medicare Other | Source: Home / Self Care | Attending: Family Medicine | Admitting: Family Medicine

## 2020-06-28 ENCOUNTER — Emergency Department
Admission: EM | Admit: 2020-06-28 | Discharge: 2020-06-28 | Disposition: A | Discharge status: Home / Self Care | Payer: Medicare Other | Attending: Emergency Medicine | Admitting: Emergency Medicine

## 2020-06-28 ENCOUNTER — Other Ambulatory Visit: Payer: Self-pay

## 2020-06-28 ENCOUNTER — Emergency Department: Payer: Medicare Other

## 2020-06-28 DIAGNOSIS — W19XXXA Unspecified fall, initial encounter: Secondary | ICD-10-CM

## 2020-06-28 DIAGNOSIS — S0990XA Unspecified injury of head, initial encounter: Secondary | ICD-10-CM | POA: Insufficient documentation

## 2020-06-28 DIAGNOSIS — Y999 Unspecified external cause status: Secondary | ICD-10-CM | POA: Insufficient documentation

## 2020-06-28 DIAGNOSIS — S0181XA Laceration without foreign body of other part of head, initial encounter: Secondary | ICD-10-CM

## 2020-06-28 DIAGNOSIS — Y939 Activity, unspecified: Secondary | ICD-10-CM | POA: Insufficient documentation

## 2020-06-28 DIAGNOSIS — Z79899 Other long term (current) drug therapy: Secondary | ICD-10-CM | POA: Insufficient documentation

## 2020-06-28 DIAGNOSIS — I10 Essential (primary) hypertension: Secondary | ICD-10-CM | POA: Insufficient documentation

## 2020-06-28 DIAGNOSIS — Z87891 Personal history of nicotine dependence: Secondary | ICD-10-CM | POA: Insufficient documentation

## 2020-06-28 DIAGNOSIS — Y929 Unspecified place or not applicable: Secondary | ICD-10-CM | POA: Insufficient documentation

## 2020-06-28 DIAGNOSIS — W01198A Fall on same level from slipping, tripping and stumbling with subsequent striking against other object, initial encounter: Secondary | ICD-10-CM | POA: Insufficient documentation

## 2020-06-28 DIAGNOSIS — S01111A Laceration without foreign body of right eyelid and periocular area, initial encounter: Secondary | ICD-10-CM | POA: Diagnosis not present

## 2020-06-28 MED ORDER — CEPHALEXIN 500 MG PO CAPS: 1000.0000 mg | ORAL_CAPSULE | Freq: Two times a day (BID) | ORAL | 0 refills | Status: DC

## 2020-06-28 NOTE — ED Provider Notes (Signed)
[facility] Emergency Department Provider Note  ____________________________________________  Time seen: Approximately 9:55 PM  I have reviewed the triage vital signs and the nursing notes.   HISTORY  Chief Complaint Fall and Head Laceration    HPI Frank Wilkins is a 69 y.o. male who presents the emergency department from [facility] urgent care for complaint of fall, facial laceration.  Patient had  sustained a mechanical fall earlier today, was seen at urgent care and referred to the emergency department for imaging and wound care.  Patient states that he did not lose consciousness during this injury.  His glasses caused the soft tissue injury to the eyebrow and nasal bridge.  Patient states that he has had no significant headache, visual changes, neck pain.  Patient was sent here for imaging and wound repair.        Past Medical History:  Diagnosis Date  . Alopecia   . Barrett esophagus   . BPH (benign prostatic hyperplasia)   . HTN (hypertension)   . Retrograde ejaculation     Patient Active Problem List   Diagnosis Date Noted  . Essential hypertension 04/06/2019  . Barrett esophagus   . Alopecia   . BPH (benign prostatic hyperplasia)     Past Surgical History:  Procedure Laterality Date  . DENTAL SURGERY    . EYE SURGERY  2010  . HERNIA REPAIR      Prior to Admission medications   Medication Sig Start Date End Date Taking? Authorizing Provider  cephALEXin (KEFLEX) 500 MG capsule Take 2 capsules (1,000 mg total) by mouth 2 (two) times daily. 06/28/20   Jovanie Verge, Delorise Royals, PA-C  erythromycin ophthalmic ointment Place 1 application into the left eye 2 (two) times daily. 07/30/19   Helane Rima, DO  lisinopril-hydrochlorothiazide (ZESTORETIC) 10-12.5 MG tablet Take 1 tablet by mouth daily. 08/05/19   Helane Rima, DO  rosuvastatin (CRESTOR) 10 MG tablet Take 1 tablet (10 mg total) by mouth daily. 08/05/19   Helane Rima, DO  Vitamin D,  Ergocalciferol, (DRISDOL) 1.25 MG (50000 UT) CAPS capsule Take 1 capsule (50,000 Units total) by mouth every 7 (seven) days. 08/05/19   Helane Rima, DO    Allergies Patient has no known allergies.  No family history on file.  Social History Social History   Tobacco Use  . Smoking status: Former Smoker    Types: Cigarettes    Quit date: 1980    Years since quitting: 41.5  . Smokeless tobacco: Never Used  Vaping Use  . Vaping Use: Never used  Substance Use Topics  . Alcohol use: Yes    Alcohol/week: 14.0 - 21.0 standard drinks    Types: 14 - 21 Cans of beer per week    Comment: 2-3 beer/day  . Drug use: Never     Review of Systems  Constitutional: No fever/chills Eyes: No visual changes. No discharge ENT: No upper respiratory complaints.  Nasal bridge laceration from mechanical fall Cardiovascular: no chest pain. Respiratory: no cough. No SOB. Gastrointestinal: No abdominal pain.  No nausea, no vomiting.  No diarrhea.  No constipation. Musculoskeletal: Negative for musculoskeletal pain. Skin: Negative for rash, abrasions, lacerations, ecchymosis. Neurological: Negative for headaches, focal weakness or numbness. 10-point ROS otherwise negative.  ____________________________________________   PHYSICAL EXAM:  VITAL SIGNS: ED Triage Vitals  Enc Vitals Group     BP 06/28/20 1736 (!) 144/103     Pulse Rate 06/28/20 1736 (!) 118     Resp 06/28/20 1736 20  Temp 06/28/20 1736 98.8 F (37.1 C)     Temp Source 06/28/20 1736 Oral     SpO2 06/28/20 1736 96 %     Weight 06/28/20 1737 (!) 210 lb (95.3 kg)     Height 06/28/20 1737 5\' 10"  (1.778 m)     Head Circumference --      Peak Flow --      Pain Score 06/28/20 1740 0     Pain Loc --      Pain Edu? --      Excl. in GC? --      Constitutional: Alert and oriented. Well appearing and in no acute distress. Eyes: Conjunctivae are normal. PERRL. EOMI. Head: Visualization of the patient's face reveals a curved  laceration starting above the right eyebrow extending between the eyebrows into the nasal bridge.  No active bleeding.  No visible foreign body.  No significant edema.  No subcutaneous emphysema.  Laceration is tender to palpation but no other palpable tenderness.  No palpable abnormality or crepitus.  No battle signs raccoon eyes or serosanguineous fluid drainage from the ears or nares. ENT:      Ears:       Nose: No congestion/rhinnorhea.      Mouth/Throat: Mucous membranes are moist.  Neck: No stridor.  No cervical spine tenderness to palpation  Cardiovascular: Normal rate, regular rhythm. Normal S1 and S2.  Good peripheral circulation. Respiratory: Normal respiratory effort without tachypnea or retractions. Lungs CTAB. Good air entry to the bases with no decreased or absent breath sounds. Musculoskeletal: Full range of motion to all extremities. No gross deformities appreciated. Neurologic:  Normal speech and language. No gross focal neurologic deficits are appreciated.  Skin:  Skin is warm, dry and intact. No rash noted. Psychiatric: Mood and affect are normal. Speech and behavior are normal. Patient exhibits appropriate insight and judgement.   ____________________________________________   LABS (all labs ordered are listed, but only abnormal results are displayed)  Labs Reviewed - No data to display ____________________________________________  EKG   ____________________________________________  RADIOLOGY I personally viewed and evaluated these images as part of my medical decision making, as well as reviewing the written report by the radiologist.  CT Head Wo Contrast  Result Date: 06/28/2020 CLINICAL DATA:  69 year old male with facial trauma. EXAM: CT HEAD WITHOUT CONTRAST TECHNIQUE: Contiguous axial images were obtained from the base of the skull through the vertex without intravenous contrast. COMPARISON:  None. FINDINGS: Brain: Mild age-related atrophy and chronic  microvascular ischemic changes. 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 There is no acute intracranial hemorrhage. No mass effect midline shift. No extra-axial fluid collection. Vascular: No hyperdense vessel or unexpected calcification. Skull: Normal. Negative for fracture or focal lesion. Sinuses/Orbits: No acute finding. Other: Laceration of the skin over the nasal bridge. IMPRESSION: 1. No acute intracranial hemorrhage. 2. Mild age-related atrophy and chronic microvascular ischemic changes. 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 Electronically Signed   By: Elgie CollardArash  Radparvar M.D.   On: 06/28/2020 18:48    ____________________________________________    PROCEDURES  Procedure(s) performed:    Marland Kitchen.Marland Kitchen.Laceration Repair  Date/Time: 06/28/2020 10:57 PM Performed by: Racheal Patchesuthriell, Tyton Abdallah D, PA-C Authorized by: Racheal Patchesuthriell, Kailin Leu D, PA-C   Consent:    Consent obtained:  Verbal   Consent given by:  Patient   Risks discussed:  Pain, poor cosmetic result, poor wound healing, need for additional repair and infection Anesthesia (see MAR for exact dosages):    Anesthesia method:  None Laceration details:  Location:  Face   Face location:  Nose   Length (cm):  6 Repair type:    Repair type:  Simple Pre-procedure details:    Preparation:  Imaging obtained to evaluate for foreign bodies Exploration:    Hemostasis achieved with:  Direct pressure   Wound exploration: wound explored through full range of motion and entire depth of wound probed and visualized     Wound extent: no foreign bodies/material noted, no muscle damage noted, no nerve damage noted, no tendon damage noted, no underlying fracture noted and no vascular damage noted     Contaminated: no   Treatment:    Area cleansed with:  Shur-Clens   Amount of cleaning:  Standard   Irrigation solution:  Sterile saline   Irrigation volume:  250 ml   Irrigation method:  Syringe Skin repair:    Repair method:  Steri-Strips and tissue  adhesive   Number of Steri-Strips:  8 Approximation:    Approximation:  Close Post-procedure details:    Dressing:  Open (no dressing)   Patient tolerance of procedure:  Tolerated well, no immediate complications Comments:     Patient presented with laceration starting above the right eyebrow extending along the nasal bridge.  After evaluating the injury I gave the patient 2 options for closure, 1st was Dermabond with Steri-Strips and 2nd with sutures.  With the pros and cons to include less pain but likely worse cosmetics from Dermabond and Steri-Strips.  Sutures would be more stable, likely improve cosmetics but would cause increased pain for closure.  After weighing the pros and cons patient opts for Dermabond and Steri-Strips.      Medications - No data to display   ____________________________________________   INITIAL IMPRESSION / ASSESSMENT AND PLAN / ED COURSE  Pertinent labs & imaging results that were available during my care of the patient were reviewed by me and considered in my medical decision making (see chart for details).  Review of the [place] CSRS was performed in accordance of the [facility] prior to dispensing any controlled drugs.           Patient's diagnosis is consistent with fall, facial laceration.  Patient presented to emergency department from of an urgent care for evaluation of mechanical fall.  Patient sustained a laceration that started above the eyebrow line through the nasal bridge.  Imaging was reassuring with no acute traumatic findings.  No evidence of concussion on physical exam.  Differential included facial fracture, intracranial hemorrhage, concussion, facial laceration.  Laceration was closed here in the emergency department.  Patient was given options for sutures versus Dermabond and Steri-Strips.  After hearing the pros and cons of each, patient opted for Dermabond and Steri-Strips.  Wound care instructions discussed with the patient.  Patient will be  placed on antibiotics prophylactically..  Follow-up with primary care as needed.  Return cautions discussed with the patient.  Patient is given ED precautions to return to the ED for any worsening or new symptoms.     ____________________________________________  FINAL CLINICAL IMPRESSION(S) / ED DIAGNOSES  Final diagnoses:  Fall, initial encounter  Laceration of forehead, initial encounter      NEW MEDICATIONS STARTED DURING THIS VISIT:  ED Discharge Orders         Ordered    cephALEXin (KEFLEX) 500 MG capsule  2 times daily     Discontinue  Reprint     06/28/20 2253              This  chart was dictated using voice recognition software/Dragon. Despite best efforts to proofread, errors can occur which can change the meaning. Any change was purely unintentional.    Racheal Patches, PA-C 06/28/20 2259    Dionne Bucy, MD 06/28/20 2317

## 2020-06-28 NOTE — ED Notes (Signed)
Pt presents to the ED for facial laceration after a fall. Pt was seen at an UC in [place] but was told to come to the ED for a CT scan. Pt is A&Ox4 and NAD at this time.

## 2020-06-28 NOTE — ED Triage Notes (Signed)
Patient states that he fell around 3pm today and hit his head on the floor. States that he tripped over his feet. Reports that he was wearing sunglasses and they cut in to him.

## 2020-06-28 NOTE — Discharge Instructions (Addendum)
Once this is fully healed, and it looks like a scar (3 to 4 months down the road, you may use the medication mederma for scar tissue improvement.

## 2020-06-28 NOTE — Discharge Instructions (Addendum)
Go directly to the emergency room

## 2020-06-28 NOTE — ED Provider Notes (Signed)
[facility] ____________________________________________  Time seen: Approximately 4:11 PM  I have reviewed the triage vital signs and the nursing notes.   HISTORY  Chief Complaint Fall and Head Laceration   HPI Frank Wilkins is a 69 y.o. male presenting for evaluation of head injury and facial laceration.  Just prior to arrival patient was at [facility], tripped over his feet per patient, falling hitting face directly on concrete floor.  Patient states that his utility glasses cut his face around his eyes.  Denies vision changes.  Denies headache, loss of consciousness, other injuries.  No recent sickness.   Past Medical History:  Diagnosis Date  . Alopecia   . Barrett esophagus   . BPH (benign prostatic hyperplasia)   . HTN (hypertension)   . Retrograde ejaculation     Patient Active Problem List   Diagnosis Date Noted  . Essential hypertension 04/06/2019  . Barrett esophagus   . Alopecia   . BPH (benign prostatic hyperplasia)     Past Surgical History:  Procedure Laterality Date  . EYE SURGERY  2010     No current facility-administered medications for this encounter.  Current Outpatient Medications:  .  erythromycin ophthalmic ointment, Place 1 application into the left eye 2 (two) times daily., Disp: 3.5 g, Rfl: 0 .  lisinopril-hydrochlorothiazide (ZESTORETIC) 10-12.5 MG tablet, Take 1 tablet by mouth daily., Disp: 30 tablet, Rfl: 4 .  rosuvastatin (CRESTOR) 10 MG tablet, Take 1 tablet (10 mg total) by mouth daily., Disp: 90 tablet, Rfl: 3 .  Vitamin D, Ergocalciferol, (DRISDOL) 1.25 MG (50000 UT) CAPS capsule, Take 1 capsule (50,000 Units total) by mouth every 7 (seven) days., Disp: 12 capsule, Rfl: 0  Allergies Patient has no known allergies.  History reviewed. No pertinent family history.  Social History Social History   Tobacco Use  . Smoking status: Former Smoker    Types: Cigarettes    Quit date: 1980    Years since quitting: 41.5  .  Smokeless tobacco: Never Used  Vaping Use  . Vaping Use: Never used  Substance Use Topics  . Alcohol use: Yes    Alcohol/week: 14.0 - 21.0 standard drinks    Types: 14 - 21 Cans of beer per week    Comment: 2-3 beer/day  . Drug use: Never    Review of Systems Constitutional: No fever Cardiovascular: Denies chest pain. Respiratory: Denies shortness of breath. Skin: Positive laceration. Neurological: Negative for headaches, focal weakness or numbness.    ____________________________________________   PHYSICAL EXAM:  VITAL SIGNS: ED Triage Vitals  Enc Vitals Group     BP 06/28/20 1555 (!) 138/88     Pulse Rate 06/28/20 1555 99     Resp 06/28/20 1555 18     Temp 06/28/20 1555 98.4 F (36.9 C)     Temp Source 06/28/20 1555 Oral     SpO2 06/28/20 1555 97 %     Weight 06/28/20 1554 (!) 235 lb 14.3 oz (107 kg)     Height 06/28/20 1554 5\' 10"  (1.778 m)     Head Circumference --      Peak Flow --      Pain Score 06/28/20 1553 2     Pain Loc --      Pain Edu? --      Excl. in GC? --     Constitutional: Alert and oriented. Well appearing and in no acute distress. Eyes: Conjunctivae are normal. PERRL. EOMI. ENT      Head: Normocephalic  Nose: None tender Cardiovascular: Good peripheral circulation. Respiratory: Normal respiratory effort without tachypnea nor retractions.   Neurologic:  Normal speech and language. No gross focal neurologic deficits are appreciated. Speech is normal. No gait instability.  Skin:  Skin is warm, dry.  Denies cervical large laceration extending from eyebrow to nasal bridge on the right, no facial asymmetry noted, mild diffuse tenderness, mild active bleeding, no foreign body noted.  Psychiatric: Mood and affect are normal. Speech and behavior are normal. Patient exhibits appropriate insight and judgment   ___________________________________________   LABS (all labs ordered are listed, but only abnormal results are displayed)  Labs  Reviewed - No data to display   PROCEDURES Procedures    INITIAL IMPRESSION / ASSESSMENT AND PLAN / ED COURSE  Pertinent labs & imaging results that were available during my care of the patient were reviewed by me and considered in my medical decision making (see chart for details).  Alert and oriented.  Mechanical tripping and fall per patient.  Head injury with facial laceration.  Recommend further evaluation emergency room at this time and likely imaging.  Patient states they will be going directly to [facility].  Dressing applied by nursing staff.  ____________________________________________   FINAL CLINICAL IMPRESSION(S) / ED DIAGNOSES  Final diagnoses:  Facial laceration, initial encounter  Injury of head, initial encounter     ED Discharge Orders    None       Note: This dictation was prepared with Dragon dictation along with smaller phrase technology. Any transcriptional errors that result from this process are unintentional.         Renford Dills, NP 06/28/20 1641

## 2020-06-28 NOTE — ED Triage Notes (Addendum)
PT to ED via POV after a fall today. PT states tripped on his own feet and fell, pushing his sunglasses into his face and cutting him. PT takes a baby aspirin a day. PT is AOx4, denies dizziness.

## 2020-10-19 ENCOUNTER — Encounter (INDEPENDENT_AMBULATORY_CARE_PROVIDER_SITE_OTHER): Payer: Self-pay | Admitting: Vascular Surgery

## 2020-10-19 ENCOUNTER — Ambulatory Visit (INDEPENDENT_AMBULATORY_CARE_PROVIDER_SITE_OTHER): Payer: Medicare Other | Admitting: Vascular Surgery

## 2020-10-19 ENCOUNTER — Other Ambulatory Visit: Payer: Self-pay

## 2020-10-19 VITALS — BP 148/83 | HR 69 | Resp 16 | Ht 70.0 in | Wt 191.6 lb

## 2020-10-19 DIAGNOSIS — I739 Peripheral vascular disease, unspecified: Secondary | ICD-10-CM | POA: Diagnosis not present

## 2020-10-19 DIAGNOSIS — M7989 Other specified soft tissue disorders: Secondary | ICD-10-CM

## 2020-10-19 DIAGNOSIS — I6529 Occlusion and stenosis of unspecified carotid artery: Secondary | ICD-10-CM | POA: Insufficient documentation

## 2020-10-19 DIAGNOSIS — E785 Hyperlipidemia, unspecified: Secondary | ICD-10-CM

## 2020-10-19 DIAGNOSIS — I10 Essential (primary) hypertension: Secondary | ICD-10-CM | POA: Diagnosis not present

## 2020-10-19 NOTE — Progress Notes (Signed)
Patient ID: Frank Wilkins, male   DOB: 07-27-1951, 69 y.o.   MRN: [id number]  Chief Complaint  Patient presents with  . New Patient (Initial Visit)    ref Burnett Sheng lymphedema    HPI Frank Wilkins is a 69 y.o. male.  I am asked to see the patient by Dr. Burnett Sheng for evaluation of leg swelling as well as a home health screening study suggesting significant PAD.  The patient is had intermittent swelling for many years.  This is associated with some prominent varicosities particular on the right leg.  Swelling progresses throughout the day.  No antecedent history of DVT or superficial thrombophlebitis to his knowledge.  No clear cause or inciting event.  Elevation helps slightly.  He also recently had a home health screening exam which suggested significant PAD worse on the left than the right.  He does not describe classic claudication symptoms although his legs do tire and he has some hip and buttock pain with activity.  Also from a previous [facility] screening, he had some degree of carotid and apparent coronary disease that has not been checked in some time.  No previous TIA, stroke, or focal neurologic symptoms.     Past Medical History:  Diagnosis Date  . Alopecia   . Barrett esophagus   . BPH (benign prostatic hyperplasia)   . HTN (hypertension)   . Retrograde ejaculation     Past Surgical History:  Procedure Laterality Date  . DENTAL SURGERY    . EYE SURGERY  2010  . HERNIA REPAIR       Family History  Problem Relation Age of Onset  . Congestive Heart Failure Mother   . Prostate cancer Paternal Uncle   . Heart attack Paternal Uncle   . Stroke Paternal Grandfather   . Heart attack Paternal Uncle      Social History   Tobacco Use  . Smoking status: Former Smoker    Types: Cigarettes    Quit date: 1980    Years since quitting: 41.9  . Smokeless tobacco: Never Used  Vaping Use  . Vaping Use: Never used  Substance Use Topics  . Alcohol use: Yes    Alcohol/week: 14.0  - 21.0 standard drinks    Types: 14 - 21 Cans of beer per week    Comment: 2-3 beer/day  . Drug use: Never    No Known Allergies  Current Outpatient Medications  Medication Sig Dispense Refill  . Apoaequorin (PREVAGEN PO) Take by mouth daily.    Marland Kitchen aspirin 81 MG EC tablet Take by mouth.    Marland Kitchen lisinopril-hydrochlorothiazide (ZESTORETIC) 10-12.5 MG tablet Take 1 tablet by mouth daily. 30 tablet 4  . Multiple Vitamin (MULTIVITAMIN) tablet Take 1 tablet by mouth daily.    . pantoprazole (PROTONIX) 20 MG tablet Take 20 mg by mouth daily.    . rosuvastatin (CRESTOR) 10 MG tablet Take 1 tablet (10 mg total) by mouth daily. 90 tablet 3  . cephALEXin (KEFLEX) 500 MG capsule Take 2 capsules (1,000 mg total) by mouth 2 (two) times daily. 28 capsule 0  . erythromycin ophthalmic ointment Place 1 application into the left eye 2 (two) times daily. 3.5 g 0  . Vitamin D, Ergocalciferol, (DRISDOL) 1.25 MG (50000 UT) CAPS capsule Take 1 capsule (50,000 Units total) by mouth every 7 (seven) days. 12 capsule 0   No current facility-administered medications for this visit.      REVIEW OF SYSTEMS (Negative unless checked)  Constitutional: [] Weight loss  []   Fever  [] Chills Cardiac: [] Chest pain   [] Chest pressure   [] Palpitations   [] Shortness of breath when laying flat   [] Shortness of breath at rest   [] Shortness of breath with exertion. Vascular:  [x] Pain in legs with walking   [x] Pain in legs at rest   [] Pain in legs when laying flat   [] Claudication   [] Pain in feet when walking  [] Pain in feet at rest  [] Pain in feet when laying flat   [] History of DVT   [] Phlebitis   [x] Swelling in legs   [] Varicose veins   [] Non-healing ulcers Pulmonary:   [] Uses home oxygen   [] Productive cough   [] Hemoptysis   [] Wheeze  [] COPD   [] Asthma Neurologic:  [] Dizziness  [] Blackouts   [] Seizures   [] History of stroke   [] History of TIA  [] Aphasia   [] Temporary blindness   [] Dysphagia   [] Weakness or numbness in arms    [] Weakness or numbness in legs Musculoskeletal:  [] Arthritis   [] Joint swelling   [] Joint pain   [] Low back pain Hematologic:  [] Easy bruising  [] Easy bleeding   [] Hypercoagulable state   [] Anemic  [] Hepatitis Gastrointestinal:  [] Blood in stool   [] Vomiting blood  [] Gastroesophageal reflux/heartburn   [] Abdominal pain Genitourinary:  [] Chronic kidney disease   [] Difficult urination  [] Frequent urination  [] Burning with urination   [] Hematuria Skin:  [] Rashes   [] Ulcers   [] Wounds Psychological:  [] History of anxiety   []  History of major depression.    Physical Exam BP (!) 148/83 (BP Location: Right Arm)   Pulse 69   Resp 16   Ht 5\' 10"  (1.778 m)   Wt 191 lb 9.6 oz (86.9 kg)   BMI 27.49 kg/m  Gen:  WD/WN, NAD. Appears younger than stated age. Head: /AT, No temporalis wasting. Ear/Nose/Throat: Hearing grossly intact, nares w/o erythema or drainage, oropharynx w/o Erythema/Exudate Eyes: Conjunctiva clear, sclera non-icteric  Neck: trachea midline.  No JVD. No bruit Pulmonary:  Good air movement, respirations not labored, no use of accessory muscles  Cardiac: RRR, no JVD Vascular:  Vessel Right Left  Radial Palpable Palpable                          DP 1+ 2+  PT 1+ 1+   Gastrointestinal:. No masses, surgical incisions, or scars. Musculoskeletal: M/S 5/5 throughout.  Extremities without ischemic changes.  No deformity or atrophy. Mild BLE edema. Neurologic: Sensation grossly intact in extremities.  Symmetrical.  Speech is fluent. Motor exam as listed above. Psychiatric: Judgment intact, Mood & affect appropriate for pt's clinical situation. Dermatologic: No rashes or ulcers noted.  No cellulitis or open wounds.    Radiology No results found.  Labs No results found for this or any previous visit (from the past 2160 hour(s)).  Assessment/Plan:  Swelling of limb I have had a long discussion with the patient regarding swelling and why it  causes symptoms.  Patient  will begin wearing graduated compression stockings class 1 (20-30 mmHg) on a daily basis a prescription was given. The patient will  beginning wearing the stockings first thing in the morning and removing them in the evening. The patient is instructed specifically not to sleep in the stockings.   In addition, behavioral modification will be initiated.  This will include frequent elevation, use of over the counter pain medications and exercise such as walking.  I have reviewed systemic causes for chronic edema such as liver, kidney and cardiac etiologies.  The patient denies problems with these organ systems.    Consideration for a lymph pump will also be made based upon the effectiveness of conservative therapy.  This would help to improve the edema control and prevent sequela such as ulcers and infections   Patient should undergo duplex ultrasound of the venous system to ensure that DVT or reflux is not present.  The patient will follow-up with me after the ultrasound.     Essential hypertension blood pressure control important in reducing the progression of atherosclerotic disease. On appropriate oral medications.   Hyperlipidemia lipid control important in reducing the progression of atherosclerotic disease. Continue statin therapy   PAD (peripheral artery disease) (HCC) Patient had a home health screening which suggested PAD.  No limb threatening symptoms, but will assess formally in the near future at his convenience.    Carotid stenosis A [facility] screening suggested some degree of carotid and coronary disease.  With atherosclerotic risk factors and suggested PAD elsewhere, a carotid duplex can be formally done at his convenience.       Festus Barren 10/19/2020, 3:28 PM   This note was created with Dragon medical transcription system.  Any errors from dictation are unintentional.

## 2020-10-19 NOTE — Assessment & Plan Note (Signed)
A [facility] screening suggested some degree of carotid and coronary disease.  With atherosclerotic risk factors and suggested PAD elsewhere, a carotid duplex can be formally done at his convenience.

## 2020-10-19 NOTE — Assessment & Plan Note (Addendum)

## 2020-10-19 NOTE — Assessment & Plan Note (Signed)
blood pressure control important in reducing the progression of atherosclerotic disease. On appropriate oral medications.  

## 2020-10-19 NOTE — Patient Instructions (Signed)
Edema  Edema is when you have too much fluid in your body or under your skin. Edema may make your legs, feet, and ankles swell up. Swelling is also common in looser tissues, like around your eyes. This is a common condition. It gets more common as you get older. There are many possible causes of edema. Eating too much salt (sodium) and being on your feet or sitting for a long time can cause edema in your legs, feet, and ankles. Hot weather may make edema worse. Edema is usually painless. Your skin may look swollen or shiny. Follow these instructions at home:  Keep the swollen body part raised (elevated) above the level of your heart when you are sitting or lying down.  Do not sit still or stand for a long time.  Do not wear tight clothes. Do not wear garters on your upper legs.  Exercise your legs. This can help the swelling go down.  Wear elastic bandages or support stockings as told by your doctor.  Eat a low-salt (low-sodium) diet to reduce fluid as told by your doctor.  Depending on the cause of your swelling, you may need to limit how much fluid you drink (fluid restriction).  Take over-the-counter and prescription medicines only as told by your doctor. Contact a doctor if:  Treatment is not working.  You have heart, liver, or kidney disease and have symptoms of edema.  You have sudden and unexplained weight gain. Get help right away if:  You have shortness of breath or chest pain.  You cannot breathe when you lie down.  You have pain, redness, or warmth in the swollen areas.  You have heart, liver, or kidney disease and get edema all of a sudden.  You have a fever and your symptoms get worse all of a sudden. Summary  Edema is when you have too much fluid in your body or under your skin.  Edema may make your legs, feet, and ankles swell up. Swelling is also common in looser tissues, like around your eyes.  Raise (elevate) the swollen body part above the level of your  heart when you are sitting or lying down.  Follow your doctor's instructions about diet and how much fluid you can drink (fluid restriction). This information is not intended to replace advice given to you by your health care provider. Make sure you discuss any questions you have with your health care provider. Document Revised: 11/23/2017 Document Reviewed: 12/08/2016 Elsevier Patient Education  2020 [employer]  

## 2020-10-19 NOTE — Assessment & Plan Note (Signed)
Patient had a home health screening which suggested PAD.  No limb threatening symptoms, but will assess formally in the near future at his convenience.

## 2020-10-19 NOTE — Assessment & Plan Note (Signed)
lipid control important in reducing the progression of atherosclerotic disease. Continue statin therapy  

## 2020-11-10 ENCOUNTER — Ambulatory Visit (INDEPENDENT_AMBULATORY_CARE_PROVIDER_SITE_OTHER): Payer: Medicare Other

## 2020-11-10 ENCOUNTER — Ambulatory Visit (INDEPENDENT_AMBULATORY_CARE_PROVIDER_SITE_OTHER): Payer: Medicare Other | Admitting: Nurse Practitioner

## 2020-11-10 ENCOUNTER — Other Ambulatory Visit: Payer: Self-pay

## 2020-11-10 ENCOUNTER — Encounter (INDEPENDENT_AMBULATORY_CARE_PROVIDER_SITE_OTHER): Payer: Self-pay | Admitting: Nurse Practitioner

## 2020-11-10 VITALS — BP 143/86 | HR 73 | Ht 70.0 in | Wt 197.0 lb

## 2020-11-10 DIAGNOSIS — M7989 Other specified soft tissue disorders: Secondary | ICD-10-CM

## 2020-11-10 DIAGNOSIS — I739 Peripheral vascular disease, unspecified: Secondary | ICD-10-CM

## 2020-11-10 DIAGNOSIS — I6529 Occlusion and stenosis of unspecified carotid artery: Secondary | ICD-10-CM

## 2020-11-10 DIAGNOSIS — R600 Localized edema: Secondary | ICD-10-CM

## 2020-11-10 DIAGNOSIS — E785 Hyperlipidemia, unspecified: Secondary | ICD-10-CM

## 2020-11-10 DIAGNOSIS — I10 Essential (primary) hypertension: Secondary | ICD-10-CM | POA: Diagnosis not present

## 2020-11-10 NOTE — Progress Notes (Signed)
Subjective:    Patient ID: Frank Wilkins, male    DOB: Apr 16, 1951, 69 y.o.   MRN: [id number] Chief Complaint  Patient presents with  . Follow-up    U/s Follow up    Frank Wilkins is a 69 y.o. male.  The patient returns today for evaluation of noninvasive studies due to recent concerning home health screenings, in addition to lower extremity swelling and some recent hip and buttock pain with activity.  The patient notes that the swelling has been off and on for several years.  It was previously worse before he lost weight.  The patient lost approximately 40 times.  He notices that with ambulation he does have pain within the buttock area around 10 to 15 minutes.  The patient notes that the pain was much worse around 4 years ago when he was actively working however now it is not as bad as previous.  The patient also notes some pain around the right lower back area.  The patient notes that elevation does help control the swelling.  The patient had almost range of motion.  Significant PAD as well as carotid artery disease.  Patient denies any previous TIA, stroke or any focal neurological symptoms   Today noninvasive studies show ABI of 1.32 on the right and 1.25 on the left.  The patient has triphasic tibial artery waveforms with good toe waveforms bilaterally.  Today noninvasive study showed no evidence of stenosis in the bilateral internal carotid arteries.  The bilateral vertebral arteries demonstrate antegrade flow with normal flow hemodynamics in the bilateral subclavian arteries.  Today noninvasive studies showed no evidence of deep venous insufficiency or superficial venous reflux in the bilateral lower extremities.  No evidence of deep vein thrombosis or superficial venous reflux seen bilaterally.   Review of Systems  Cardiovascular: Positive for leg swelling.  Genitourinary: Positive for flank pain.  Musculoskeletal: Positive for back pain.  All other systems reviewed and are  negative.      Objective:   Physical Exam Vitals reviewed.  HENT:     Head: Normocephalic.  Cardiovascular:     Rate and Rhythm: Normal rate.     Pulses: Normal pulses.  Pulmonary:     Effort: Pulmonary effort is normal.  Skin:    General: Skin is warm and dry.  Neurological:     Mental Status: He is alert and oriented to person, place, and time.  Psychiatric:        Mood and Affect: Mood normal.        Behavior: Behavior normal.        Thought Content: Thought content normal.        Judgment: Judgment normal.     BP (!) 143/86   Pulse 73   Ht 5\' 10"  (1.778 m)   Wt 197 lb (89.4 kg)   BMI 28.27 kg/m   Past Medical History:  Diagnosis Date  . Alopecia   . Barrett esophagus   . BPH (benign prostatic hyperplasia)   . HTN (hypertension)   . Retrograde ejaculation     Social History   Socioeconomic History  . Marital status: Divorced    Spouse name: Not on file  . Number of children: Not on file  . Years of education: Not on file  . Highest education level: Not on file  Occupational History  . Occupation: Retired  Tobacco Use  . Smoking status: Former Smoker    Types: Cigarettes    Quit date: 1980  Years since quitting: 41.9  . Smokeless tobacco: Never Used  Vaping Use  . Vaping Use: Never used  Substance and Sexual Activity  . Alcohol use: Yes    Alcohol/week: 14.0 - 21.0 standard drinks    Types: 14 - 21 Cans of beer per week    Comment: 2-3 beer/day  . Drug use: Never  . Sexual activity: Yes    Partners: Female  Other Topics Concern  . Not on file  Social History Narrative   Lifescreening. Mild on CVD test, Mild on carotid test, AAA normal, PAD normal, osteoporosis low risk.      Retired. Weight increased from 195 to 225. "I eat what I want." No exercise. Former smoker. Drinks "a couple" of beer per day and has for years.       Per patient, Hx of Barrett's esophagus, s/p EGD two years ago.       BPH, s/p cystoscopy with "prostate flap."  Avodart caused retrograde ejaculation.    Social Determinants of Health   Financial Resource Strain:   . Difficulty of Paying Living Expenses: Not on file  Food Insecurity:   . Worried About Programme researcher, broadcasting/film/video in the Last Year: Not on file  . Ran Out of Food in the Last Year: Not on file  Transportation Needs:   . Lack of Transportation (Medical): Not on file  . Lack of Transportation (Non-Medical): Not on file  Physical Activity:   . Days of Exercise per Week: Not on file  . Minutes of Exercise per Session: Not on file  Stress:   . Feeling of Stress : Not on file  Social Connections:   . Frequency of Communication with Friends and Family: Not on file  . Frequency of Social Gatherings with Friends and Family: Not on file  . Attends Religious Services: Not on file  . Active Member of Clubs or Organizations: Not on file  . Attends Banker Meetings: Not on file  . Marital Status: Not on file  Intimate Partner Violence:   . Fear of Current or Ex-Partner: Not on file  . Emotionally Abused: Not on file  . Physically Abused: Not on file  . Sexually Abused: Not on file    Past Surgical History:  Procedure Laterality Date  . DENTAL SURGERY    . EYE SURGERY  2010  . HERNIA REPAIR      Family History  Problem Relation Age of Onset  . Congestive Heart Failure Mother   . Prostate cancer Paternal Uncle   . Heart attack Paternal Uncle   . Stroke Paternal Grandfather   . Heart attack Paternal Uncle     No Known Allergies  CBC Latest Ref Rng & Units 07/30/2019  WBC 4.0 - 10.5 K/uL 5.3  Hemoglobin 13.0 - 17.0 g/dL 52.7  Hematocrit 39 - 52 % 46.3  Platelets 150 - 400 K/uL 205.0      CMP     Component Value Date/Time   NA 138 07/30/2019 1030   K 4.8 07/30/2019 1030   CL 99 07/30/2019 1030   CO2 29 07/30/2019 1030   GLUCOSE 109 (H) 07/30/2019 1030   BUN 11 07/30/2019 1030   CREATININE 1.05 07/30/2019 1030   CALCIUM 9.7 07/30/2019 1030   PROT 7.3 07/30/2019  1030   ALBUMIN 4.3 07/30/2019 1030   AST 46 (H) 07/30/2019 1030   ALT 54 (H) 07/30/2019 1030   ALKPHOS 63 07/30/2019 1030   BILITOT 0.6 07/30/2019 1030  No results found.     Assessment & Plan:   1. Swelling of limb No surgery or intervention at this point in time.  I have reviewed my discussion with the patient regarding venous insufficiency and why it causes symptoms. I have discussed with the patient the chronic skin changes that accompany venous insufficiency and the long term sequela such as ulceration. Patient will weari graduated compression stockings on a daily basis. The patient will put the stockings on first thing in the morning and removing them in the evening. The patient is reminded not to sleep in the stockings.  In addition, behavioral modification including elevation during the day will be initiated. Exercise is strongly encouraged.  The patient will follow up with me PRN should anything change.  The patient voices agreement with this plan.   2. PAD (peripheral artery disease) (HCC) Today ABIs were indicative of no evidence of significant bilateral lower extremity arterial disease.  The patient does have some pain with activity however I suspect that his pseudoclaudication caused by issues within the lower back.  Patient is referred to the primary care service for further work-up of pain and discomfort.  However, because the patient has no evidence of significant peripheral arterial disease in the left and follow-up on an as-needed basis.  3. Stenosis of carotid artery, unspecified laterality Based on noninvasive studies the patient has no evidence of stenosis in the bilateral internal carotid arteries.  The bilateral vertebral arteries have antegrade flow with normal flow hemodynamics seen in the bilateral subclavian arteries.  Essentially, the patient did not have any evidence of significant atherosclerotic disease within the carotid arteries.  4. Essential  hypertension Continue antihypertensive medications as already ordered, these medications have been reviewed and there are no changes at this time.   5. Hyperlipidemia, unspecified hyperlipidemia type Continue statin as ordered and reviewed, no changes at this time    Current Outpatient Medications on File Prior to Visit  Medication Sig Dispense Refill  . Apoaequorin (PREVAGEN PO) Take by mouth daily.    Marland Kitchen aspirin 81 MG EC tablet Take by mouth.    Marland Kitchen lisinopril-hydrochlorothiazide (ZESTORETIC) 10-12.5 MG tablet Take 1 tablet by mouth daily. 30 tablet 4  . Multiple Vitamin (MULTIVITAMIN) tablet Take 1 tablet by mouth daily.    . pantoprazole (PROTONIX) 20 MG tablet Take 20 mg by mouth daily.    . rosuvastatin (CRESTOR) 10 MG tablet Take 1 tablet (10 mg total) by mouth daily. 90 tablet 3  . cephALEXin (KEFLEX) 500 MG capsule Take 2 capsules (1,000 mg total) by mouth 2 (two) times daily. 28 capsule 0  . erythromycin ophthalmic ointment Place 1 application into the left eye 2 (two) times daily. 3.5 g 0  . Vitamin D, Ergocalciferol, (DRISDOL) 1.25 MG (50000 UT) CAPS capsule Take 1 capsule (50,000 Units total) by mouth every 7 (seven) days. 12 capsule 0   No current facility-administered medications on file prior to visit.    Patient Instructions  Compression Socks  . The patient is to wear medical grade one graduate compression stockings (20-30 mmHg) on a daily basis.  . The socks should be placed first thing in the morning and removed in the evening . The patient was instructed specifically not to sleep in the socks. . Socks should be replaced on an annual basis   Elevation . The patient was encouraged to elevate the bilateral lower extremity as often as possible.  . Proper elevation is heart level or higher  . This  includes elevation during the day    No follow-ups on file.   Georgiana SpinnerFallon E Ellene Bloodsaw, NP

## 2020-11-10 NOTE — Patient Instructions (Signed)
Compression Socks  The patient is to wear medical grade one graduate compression stockings (20-30 mmHg) on a daily basis.  The socks should be placed first thing in the morning and removed in the evening The patient was instructed specifically not to sleep in the socks. Socks should be replaced on an annual basis   Elevation The patient was encouraged to elevate the bilateral lower extremity as often as possible.  Proper elevation is heart level or higher  This includes elevation during the day    

## 2022-06-08 IMAGING — CT CT HEAD W/O CM
3 series · 15 of 47 positions shown, 18 images · non-contrast
Comparison: None.

CLINICAL DATA: 69-year-old male with facial trauma.

EXAM:
CT HEAD WITHOUT CONTRAST
TECHNIQUE: Contiguous axial images were obtained from the base of the skull
through the vertex without intravenous contrast.

[Series 2: head wo · axial · 0.47mm/px · z∈[-162,-27]mm · 9 of 33 slices shown, 12 images]
[im 3/33  brain]
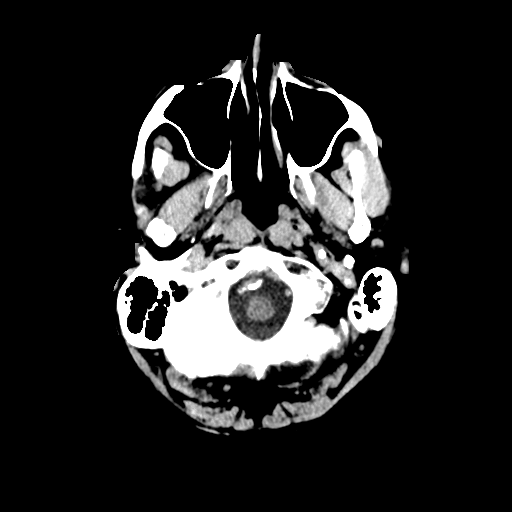
[im 3/33  bone]
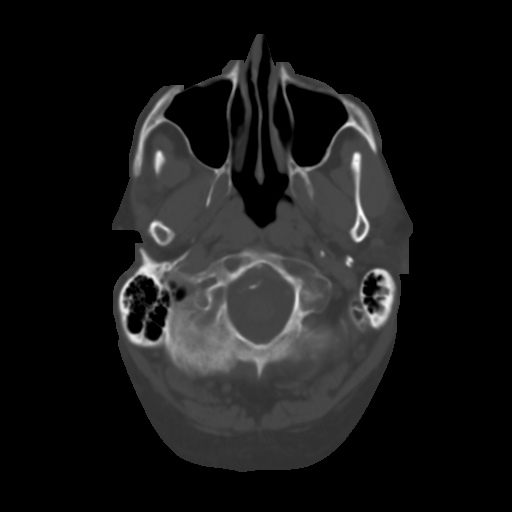
[im 6/33  brain]
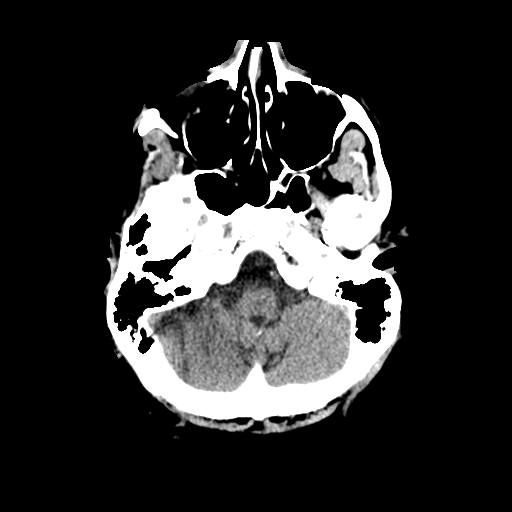
[im 9/33  brain]
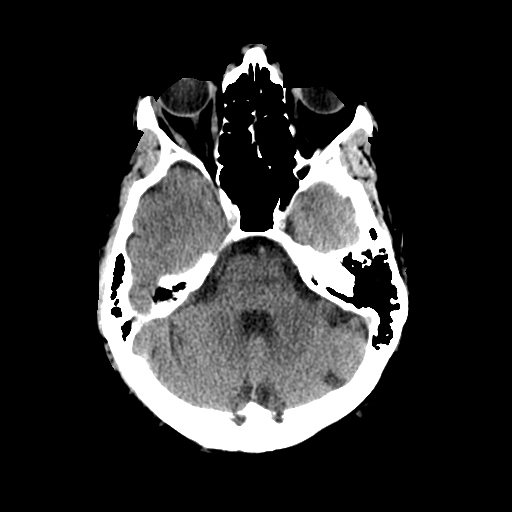
[im 13/33  brain]
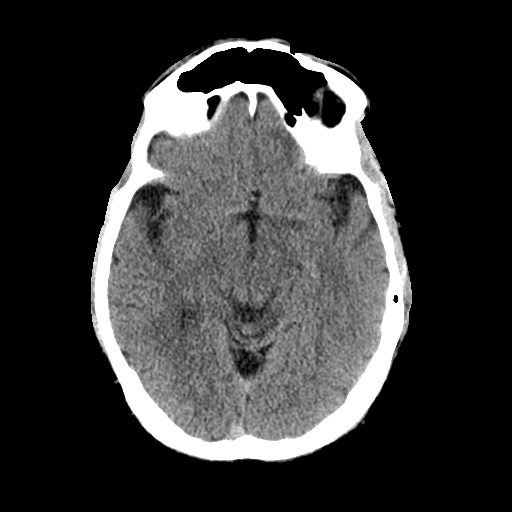
[im 17/33  brain]
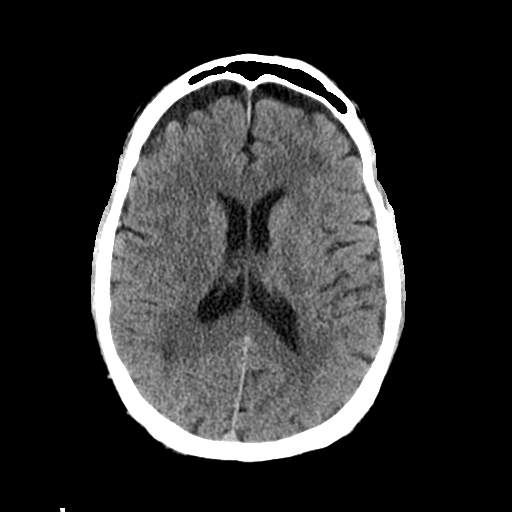
[im 17/33  bone]
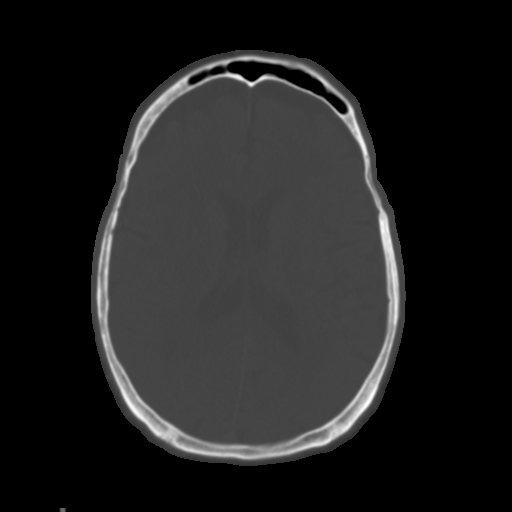
[im 20/33  brain]
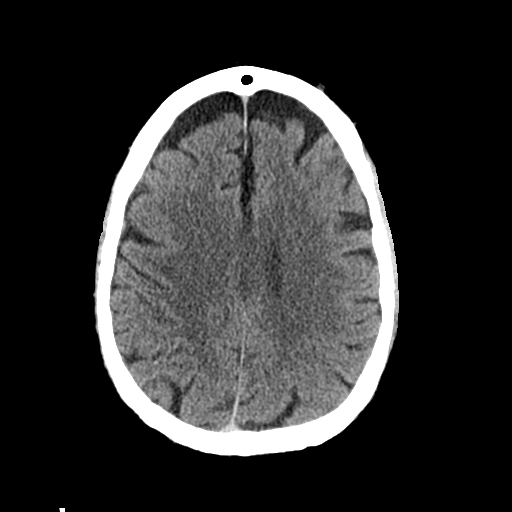
[im 24/33  brain]
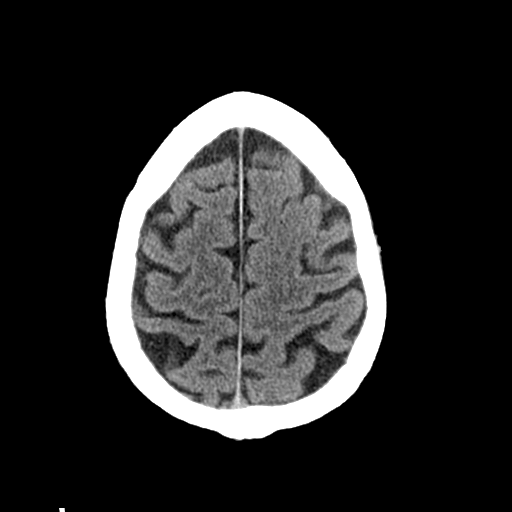
[im 27/33  brain]
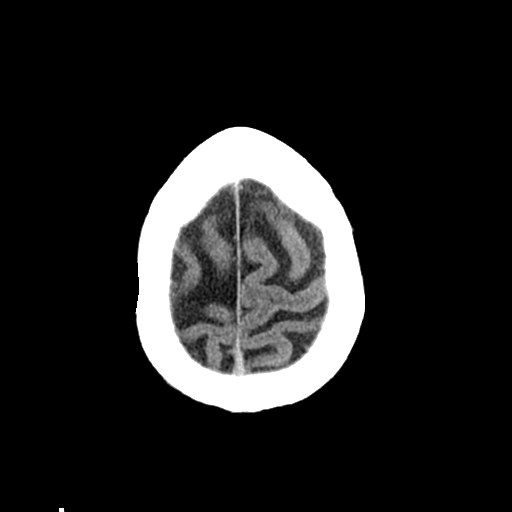
[im 30/33  brain]
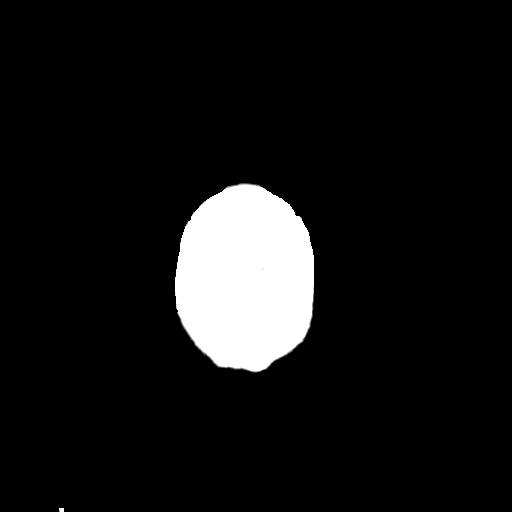
[im 30/33  bone]
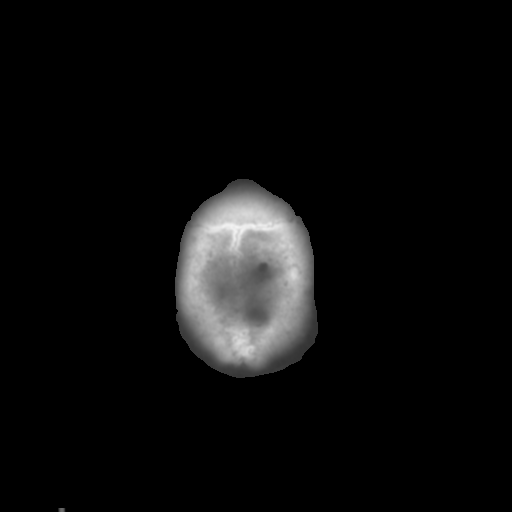

[Series 4: coronal soft tissue · coronal · 0.32mm/px · 3 of 67 slices shown]
[im 23/67  brain]
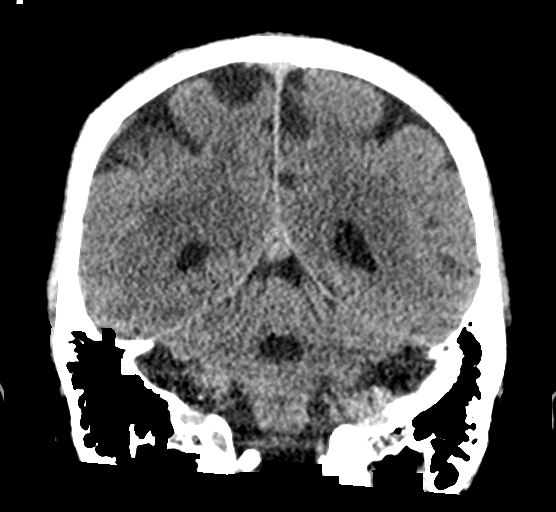
[im 30/67  brain]
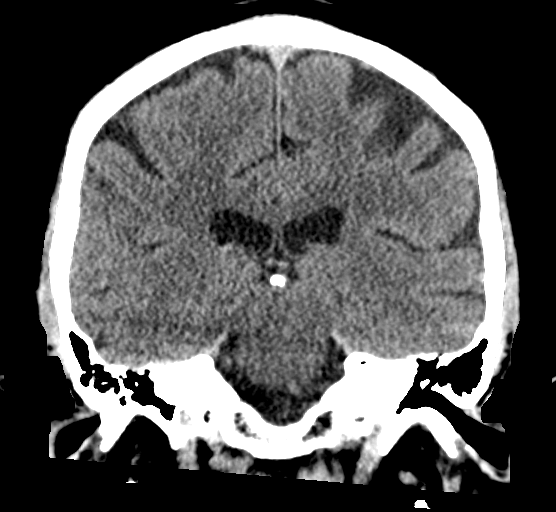
[im 37/67  brain]
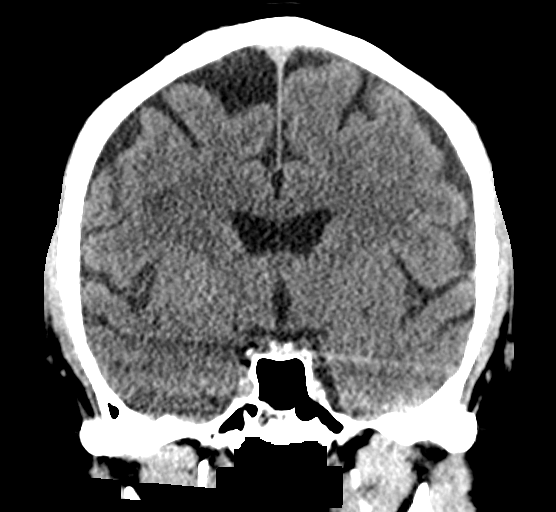

[Series 5: sagittal soft tissue · sagittal · 0.32mm/px · 3 of 54 slices shown]
[im 18/54  brain]
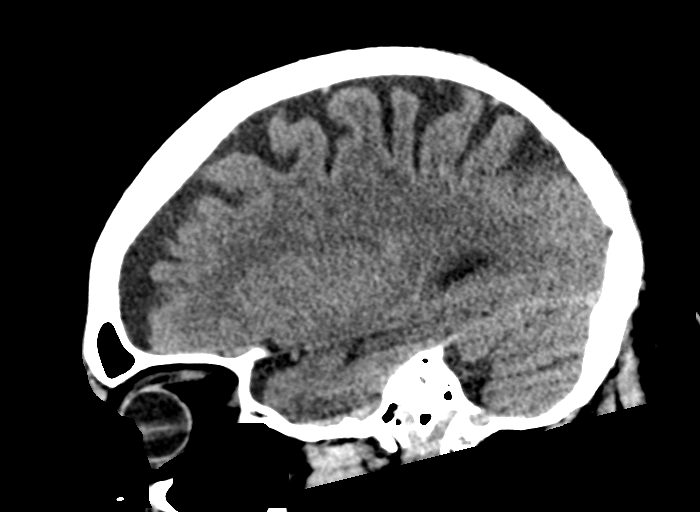
[im 27/54  brain]
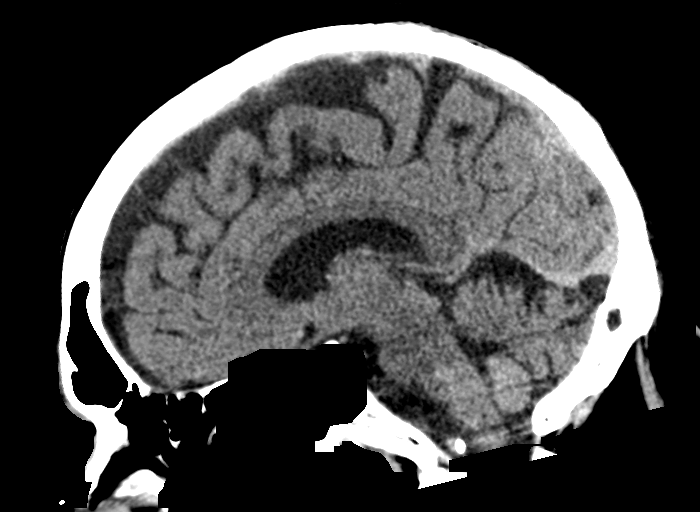
[im 36/54  brain]
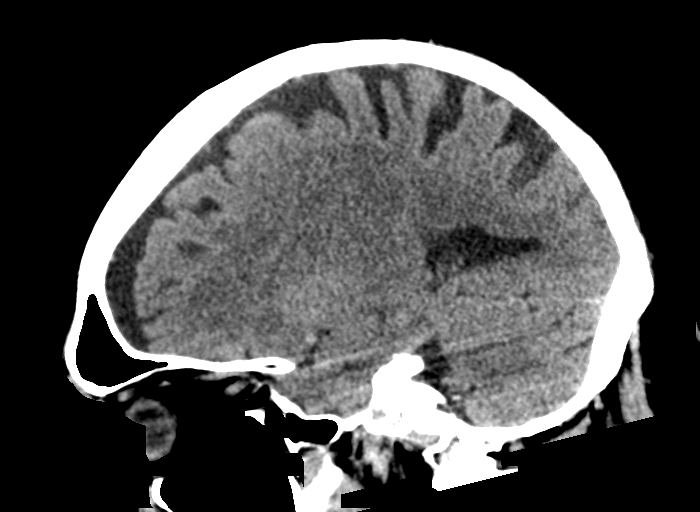

[15 of 47 positions shown; findings below may reference images not displayed]

FINDINGS: Brain: Mild age-related atrophy and chronic microvascular ischemic
changes. Small focal left cerebellar old infarct. There is no acute
intracranial hemorrhage. No mass effect midline shift. No
extra-axial fluid collection.

Vascular: No hyperdense vessel or unexpected calcification.

Skull: Normal. Negative for fracture or focal lesion.

Sinuses/Orbits: No acute finding.

Other: Laceration of the skin over the nasal bridge.
IMPRESSION: 1. No acute intracranial hemorrhage.
2. Mild age-related atrophy and chronic microvascular ischemic
changes. Small focal left cerebellar old infarct.

## 2022-10-02 ENCOUNTER — Encounter (INDEPENDENT_AMBULATORY_CARE_PROVIDER_SITE_OTHER): Payer: Self-pay

## 2024-04-07 ENCOUNTER — Other Ambulatory Visit: Payer: Self-pay | Admitting: Cardiology

## 2024-04-07 DIAGNOSIS — I10 Essential (primary) hypertension: Secondary | ICD-10-CM

## 2024-04-07 DIAGNOSIS — I2510 Atherosclerotic heart disease of native coronary artery without angina pectoris: Secondary | ICD-10-CM

## 2024-04-07 DIAGNOSIS — R079 Chest pain, unspecified: Secondary | ICD-10-CM

## 2024-04-22 ENCOUNTER — Telehealth (HOSPITAL_COMMUNITY): Payer: Self-pay | Admitting: *Deleted

## 2024-04-22 NOTE — Telephone Encounter (Signed)
 Reaching out to patient to offer assistance regarding upcoming cardiac imaging study; pt verbalizes understanding of appt date/time, parking situation and where to check in, pre-test NPO status and medications ordered, and verified current allergies; name and call back number provided for further questions should they arise  Larey Brick RN Navigator Cardiac Imaging [facility] [phone] office [phone] cell

## 2024-04-24 ENCOUNTER — Ambulatory Visit
Admission: RE | Admit: 2024-04-24 | Discharge: 2024-04-24 | Disposition: A | Discharge status: Home / Self Care | Source: Ambulatory Visit | Attending: Cardiology | Admitting: Cardiology

## 2024-04-24 ENCOUNTER — Other Ambulatory Visit: Payer: Self-pay | Admitting: Cardiology

## 2024-04-24 DIAGNOSIS — R079 Chest pain, unspecified: Secondary | ICD-10-CM | POA: Diagnosis present

## 2024-04-24 DIAGNOSIS — R931 Abnormal findings on diagnostic imaging of heart and coronary circulation: Secondary | ICD-10-CM | POA: Insufficient documentation

## 2024-04-24 DIAGNOSIS — I2510 Atherosclerotic heart disease of native coronary artery without angina pectoris: Secondary | ICD-10-CM | POA: Diagnosis present

## 2024-04-24 DIAGNOSIS — I10 Essential (primary) hypertension: Secondary | ICD-10-CM | POA: Diagnosis present

## 2024-04-24 MED ORDER — NITROGLYCERIN 0.4 MG SL SUBL
0.8000 mg | SUBLINGUAL_TABLET | Freq: Once | SUBLINGUAL | Status: AC
  Administered 2024-04-24: 0.8 mg via SUBLINGUAL

## 2024-04-24 MED ORDER — NITROGLYCERIN 0.4 MG SL SUBL
SUBLINGUAL_TABLET | SUBLINGUAL | Status: AC
  Filled 2024-04-24: qty 2

## 2024-04-24 MED ORDER — IOHEXOL 350 MG/ML SOLN
80.0000 mL | Freq: Once | INTRAVENOUS | Status: AC | PRN
  Administered 2024-04-24: 80 mL via INTRAVENOUS

## 2024-04-24 MED ORDER — DILTIAZEM HCL 25 MG/5ML IV SOLN: 10.0000 mg | INTRAVENOUS | Status: DC | PRN

## 2024-04-24 MED ORDER — METOPROLOL TARTRATE 5 MG/5ML IV SOLN: 10.0000 mg | Freq: Once | INTRAVENOUS | Status: DC | PRN

## 2024-04-24 NOTE — Progress Notes (Signed)
Patient tolerated procedure well. W/C to lobby.  Ambulate w/o difficulty. Denies light headedness or being dizzy. Encouraged to drink extra water today and reasoning explained. Verbalized understanding. All questions answered. ABC intact. No further needs. Discharge from procedure area w/o issues.

## 2024-10-07 ENCOUNTER — Other Ambulatory Visit: Payer: Self-pay

## 2024-10-07 ENCOUNTER — Encounter
Admission: RE | Disposition: A | Discharge status: Home / Self Care | Payer: Self-pay | Source: Home / Self Care | Attending: Cardiology

## 2024-10-07 ENCOUNTER — Encounter: Payer: Self-pay | Admitting: Cardiology

## 2024-10-07 ENCOUNTER — Ambulatory Visit
Admission: RE | Admit: 2024-10-07 | Discharge: 2024-10-07 | Disposition: A | Discharge status: Home / Self Care | Attending: Cardiology | Admitting: Cardiology

## 2024-10-07 DIAGNOSIS — E7800 Pure hypercholesterolemia, unspecified: Secondary | ICD-10-CM | POA: Diagnosis not present

## 2024-10-07 DIAGNOSIS — R079 Chest pain, unspecified: Secondary | ICD-10-CM

## 2024-10-07 DIAGNOSIS — I2510 Atherosclerotic heart disease of native coronary artery without angina pectoris: Secondary | ICD-10-CM | POA: Diagnosis not present

## 2024-10-07 DIAGNOSIS — Z79899 Other long term (current) drug therapy: Secondary | ICD-10-CM | POA: Insufficient documentation

## 2024-10-07 DIAGNOSIS — I472 Ventricular tachycardia, unspecified: Secondary | ICD-10-CM | POA: Insufficient documentation

## 2024-10-07 DIAGNOSIS — I10 Essential (primary) hypertension: Secondary | ICD-10-CM | POA: Insufficient documentation

## 2024-10-07 HISTORY — PX: CORONARY PRESSURE/FFR STUDY: CATH118243

## 2024-10-07 HISTORY — PX: LEFT HEART CATH AND CORONARY ANGIOGRAPHY: CATH118249

## 2024-10-07 LAB — POCT ACTIVATED CLOTTING TIME: Activated Clotting Time: 239 s

## 2024-10-07 SURGERY — LEFT HEART CATH AND CORONARY ANGIOGRAPHY
Anesthesia: Moderate Sedation

## 2024-10-07 MED ORDER — IOHEXOL 300 MG/ML  SOLN
INTRAMUSCULAR | Status: DC | PRN
  Administered 2024-10-07: 155 mL

## 2024-10-07 MED ORDER — LABETALOL HCL 5 MG/ML IV SOLN: 10.0000 mg | INTRAVENOUS | Status: DC | PRN

## 2024-10-07 MED ORDER — HYDRALAZINE HCL 20 MG/ML IJ SOLN: 10.0000 mg | INTRAMUSCULAR | Status: DC | PRN

## 2024-10-07 MED ORDER — SODIUM CHLORIDE 0.9% FLUSH: 3.0000 mL | Freq: Two times a day (BID) | INTRAVENOUS | Status: DC

## 2024-10-07 MED ORDER — FREE WATER
500.0000 mL | Freq: Once | Status: DC
Start: 2024-10-07 — End: 2024-10-07

## 2024-10-07 MED ORDER — ASPIRIN 81 MG PO CHEW
CHEWABLE_TABLET | ORAL | Status: AC
  Filled 2024-10-07: qty 1

## 2024-10-07 MED ORDER — MIDAZOLAM HCL (PF) 2 MG/2ML IJ SOLN
INTRAMUSCULAR | Status: DC | PRN
  Administered 2024-10-07 (×2): 1 mg via INTRAVENOUS

## 2024-10-07 MED ORDER — HEPARIN SODIUM (PORCINE) 1000 UNIT/ML IJ SOLN
INTRAMUSCULAR | Status: AC
Start: 2024-10-07 — End: 2024-10-07
  Filled 2024-10-07: qty 10

## 2024-10-07 MED ORDER — SODIUM CHLORIDE 0.9% FLUSH: 3.0000 mL | INTRAVENOUS | Status: DC | PRN

## 2024-10-07 MED ORDER — ACETAMINOPHEN 325 MG PO TABS
ORAL_TABLET | ORAL | Status: AC
  Filled 2024-10-07: qty 2

## 2024-10-07 MED ORDER — VERAPAMIL HCL 2.5 MG/ML IV SOLN
INTRAVENOUS | Status: AC
  Filled 2024-10-07: qty 2

## 2024-10-07 MED ORDER — LIDOCAINE HCL (PF) 1 % IJ SOLN
INTRAMUSCULAR | Status: DC | PRN
  Administered 2024-10-07: 2 mL

## 2024-10-07 MED ORDER — MIDAZOLAM HCL 2 MG/2ML IJ SOLN
INTRAMUSCULAR | Status: AC
  Filled 2024-10-07: qty 2

## 2024-10-07 MED ORDER — FENTANYL CITRATE (PF) 100 MCG/2ML IJ SOLN
INTRAMUSCULAR | Status: AC
  Filled 2024-10-07: qty 2

## 2024-10-07 MED ORDER — FREE WATER
500.0000 mL | Freq: Once | Status: AC
  Administered 2024-10-07: 500 mL via ORAL

## 2024-10-07 MED ORDER — SODIUM CHLORIDE 0.9 % IV SOLN: 250.0000 mL | INTRAVENOUS | Status: DC | PRN

## 2024-10-07 MED ORDER — HEPARIN SODIUM (PORCINE) 1000 UNIT/ML IJ SOLN
INTRAMUSCULAR | Status: DC | PRN
Start: 2024-10-07 — End: 2024-10-07
  Administered 2024-10-07: 2000 [IU] via INTRAVENOUS
  Administered 2024-10-07: 4500 [IU] via INTRAVENOUS
  Administered 2024-10-07: 5000 [IU] via INTRAVENOUS

## 2024-10-07 MED ORDER — ONDANSETRON HCL 4 MG/2ML IJ SOLN: 4.0000 mg | Freq: Four times a day (QID) | INTRAMUSCULAR | Status: DC | PRN

## 2024-10-07 MED ORDER — SODIUM CHLORIDE 0.9 % IV SOLN
250.0000 mL | INTRAVENOUS | Status: DC | PRN
  Administered 2024-10-07: 250 mL via INTRAVENOUS

## 2024-10-07 MED ORDER — HEPARIN (PORCINE) IN NACL 1000-0.9 UT/500ML-% IV SOLN
INTRAVENOUS | Status: DC | PRN
  Administered 2024-10-07: 500 mL
  Administered 2024-10-07: 1000 mL

## 2024-10-07 MED ORDER — LIDOCAINE HCL 1 % IJ SOLN
INTRAMUSCULAR | Status: AC
  Filled 2024-10-07: qty 20

## 2024-10-07 MED ORDER — ASPIRIN 81 MG PO CHEW
81.0000 mg | CHEWABLE_TABLET | ORAL | Status: AC
  Administered 2024-10-07: 81 mg via ORAL

## 2024-10-07 MED ORDER — ACETAMINOPHEN 325 MG PO TABS
650.0000 mg | ORAL_TABLET | ORAL | Status: DC | PRN
  Administered 2024-10-07: 650 mg via ORAL

## 2024-10-07 MED ORDER — HEPARIN (PORCINE) IN NACL 1000-0.9 UT/500ML-% IV SOLN
INTRAVENOUS | Status: AC
  Filled 2024-10-07: qty 1000

## 2024-10-07 MED ORDER — FENTANYL CITRATE (PF) 100 MCG/2ML IJ SOLN
INTRAMUSCULAR | Status: DC | PRN
  Administered 2024-10-07 (×2): 50 ug via INTRAVENOUS

## 2024-10-07 MED ORDER — VERAPAMIL HCL 2.5 MG/ML IV SOLN
INTRAVENOUS | Status: DC | PRN
  Administered 2024-10-07: 2.5 mg via INTRA_ARTERIAL

## 2024-10-07 SURGICAL SUPPLY — 16 items
CATH INFINITI 5 FR JL3.5 (CATHETERS) IMPLANT
CATH INFINITI JR4 5F (CATHETERS) IMPLANT
CATH LAUNCHER 5F EBU3.5 (CATHETERS) IMPLANT
DEVICE RAD TR BAND REGULAR (VASCULAR PRODUCTS) IMPLANT
DRAPE BRACHIAL (DRAPES) IMPLANT
GLIDESHEATH SLEND SS 6F .021 (SHEATH) IMPLANT
GUIDEWIRE INQWIRE 1.5J.035X260 (WIRE) IMPLANT
GUIDEWIRE PRESS OMNI 185 ST (WIRE) IMPLANT
GUIDEWIRE PRESSURE X 175 (WIRE) IMPLANT
KIT ESSENTIALS PG (KITS) IMPLANT
KIT SYRINGE INJ CVI SPIKEX1 (MISCELLANEOUS) IMPLANT
PACK CARDIAC CATH (CUSTOM PROCEDURE TRAY) ×2 IMPLANT
SET ATX-X65L (MISCELLANEOUS) IMPLANT
SHEATH 6FR 75 DEST SLENDER (SHEATH) IMPLANT
STATION PROTECTION PRESSURIZED (MISCELLANEOUS) IMPLANT
TUBING CIL FLEX 10 FLL-RA (TUBING) IMPLANT

## 2024-10-13 ENCOUNTER — Encounter: Payer: Self-pay | Admitting: Cardiology
